# Patient Record
Sex: Female | Born: 1970 | Race: Black or African American | Hispanic: No | Marital: Single | Smoking: Former smoker
Health system: Southern US, Community
[De-identification: ages and names within clinical notes are randomized; demographics above are authoritative.]

## PROBLEM LIST (undated history)

## (undated) ENCOUNTER — Emergency Department (HOSPITAL_COMMUNITY): Admission: EM | Payer: PRIVATE HEALTH INSURANCE | Source: Home / Self Care

## (undated) DIAGNOSIS — O24419 Gestational diabetes mellitus in pregnancy, unspecified control: Secondary | ICD-10-CM

## (undated) DIAGNOSIS — K5792 Diverticulitis of intestine, part unspecified, without perforation or abscess without bleeding: Secondary | ICD-10-CM

## (undated) DIAGNOSIS — D72829 Elevated white blood cell count, unspecified: Secondary | ICD-10-CM

## (undated) DIAGNOSIS — I10 Essential (primary) hypertension: Secondary | ICD-10-CM

## (undated) DIAGNOSIS — Z113 Encounter for screening for infections with a predominantly sexual mode of transmission: Secondary | ICD-10-CM

## (undated) DIAGNOSIS — G44219 Episodic tension-type headache, not intractable: Secondary | ICD-10-CM

## (undated) HISTORY — DX: Episodic tension-type headache, not intractable: G44.219

## (undated) HISTORY — DX: Elevated white blood cell count, unspecified: D72.829

## (undated) HISTORY — DX: Gestational diabetes mellitus in pregnancy, unspecified control: O24.419

## (undated) HISTORY — DX: Diverticulitis of intestine, part unspecified, without perforation or abscess without bleeding: K57.92

## (undated) HISTORY — DX: Encounter for screening for infections with a predominantly sexual mode of transmission: Z11.3

---

## 1997-08-27 HISTORY — PX: TUBAL LIGATION: SHX77

## 2000-04-09 ENCOUNTER — Emergency Department (HOSPITAL_COMMUNITY): Admission: EM | Admit: 2000-04-09 | Discharge: 2000-04-09 | Payer: Self-pay | Admitting: Emergency Medicine

## 2001-03-14 ENCOUNTER — Emergency Department (HOSPITAL_COMMUNITY): Admission: EM | Admit: 2001-03-14 | Discharge: 2001-03-14 | Payer: Self-pay | Admitting: Emergency Medicine

## 2001-04-17 ENCOUNTER — Emergency Department (HOSPITAL_COMMUNITY): Admission: EM | Admit: 2001-04-17 | Discharge: 2001-04-18 | Payer: Self-pay | Admitting: Emergency Medicine

## 2001-12-04 ENCOUNTER — Emergency Department (HOSPITAL_COMMUNITY): Admission: EM | Admit: 2001-12-04 | Discharge: 2001-12-04 | Payer: Self-pay

## 2002-02-01 ENCOUNTER — Emergency Department (HOSPITAL_COMMUNITY): Admission: EM | Admit: 2002-02-01 | Discharge: 2002-02-02 | Payer: Self-pay | Admitting: Emergency Medicine

## 2002-02-02 ENCOUNTER — Encounter: Payer: Self-pay | Admitting: *Deleted

## 2002-08-18 ENCOUNTER — Emergency Department (HOSPITAL_COMMUNITY): Admission: EM | Admit: 2002-08-18 | Discharge: 2002-08-18 | Payer: Self-pay | Admitting: Emergency Medicine

## 2003-03-23 ENCOUNTER — Emergency Department (HOSPITAL_COMMUNITY): Admission: EM | Admit: 2003-03-23 | Discharge: 2003-03-23 | Payer: Self-pay | Admitting: Emergency Medicine

## 2003-04-22 ENCOUNTER — Emergency Department (HOSPITAL_COMMUNITY): Admission: EM | Admit: 2003-04-22 | Discharge: 2003-04-22 | Payer: Self-pay | Admitting: Emergency Medicine

## 2003-04-23 ENCOUNTER — Encounter: Payer: Self-pay | Admitting: Emergency Medicine

## 2003-04-23 ENCOUNTER — Ambulatory Visit (HOSPITAL_COMMUNITY): Admission: RE | Admit: 2003-04-23 | Discharge: 2003-04-23 | Payer: Self-pay | Admitting: Emergency Medicine

## 2003-12-16 ENCOUNTER — Emergency Department (HOSPITAL_COMMUNITY): Admission: EM | Admit: 2003-12-16 | Discharge: 2003-12-16 | Payer: Self-pay | Admitting: Emergency Medicine

## 2010-05-27 LAB — HM PAP SMEAR: HM Pap smear: NORMAL

## 2010-08-27 LAB — HM PAP SMEAR: HM Pap smear: NORMAL

## 2010-12-09 ENCOUNTER — Encounter (HOSPITAL_COMMUNITY): Payer: Self-pay | Admitting: Radiology

## 2010-12-09 ENCOUNTER — Emergency Department (HOSPITAL_COMMUNITY): Payer: PRIVATE HEALTH INSURANCE

## 2010-12-09 ENCOUNTER — Emergency Department (HOSPITAL_COMMUNITY)
Admission: EM | Admit: 2010-12-09 | Discharge: 2010-12-09 | Disposition: A | Discharge status: Home / Self Care | Payer: PRIVATE HEALTH INSURANCE | Attending: Emergency Medicine | Admitting: Emergency Medicine

## 2010-12-09 DIAGNOSIS — R109 Unspecified abdominal pain: Secondary | ICD-10-CM | POA: Insufficient documentation

## 2010-12-09 DIAGNOSIS — I10 Essential (primary) hypertension: Secondary | ICD-10-CM | POA: Insufficient documentation

## 2010-12-09 DIAGNOSIS — K7689 Other specified diseases of liver: Secondary | ICD-10-CM | POA: Insufficient documentation

## 2010-12-09 DIAGNOSIS — K5730 Diverticulosis of large intestine without perforation or abscess without bleeding: Secondary | ICD-10-CM | POA: Insufficient documentation

## 2010-12-09 HISTORY — DX: Essential (primary) hypertension: I10

## 2010-12-09 LAB — CBC
HCT: 40.1 % (ref 36.0–46.0)
Hemoglobin: 13.3 g/dL (ref 12.0–15.0)
MCH: 28.9 pg (ref 26.0–34.0)
MCHC: 33.2 g/dL (ref 30.0–36.0)
MCV: 87 fL (ref 78.0–100.0)
Platelets: 287 10*3/uL (ref 150–400)
RBC: 4.61 MIL/uL (ref 3.87–5.11)
RDW: 13.5 % (ref 11.5–15.5)
WBC: 11.1 10*3/uL — ABNORMAL HIGH (ref 4.0–10.5)

## 2010-12-09 LAB — URINALYSIS, ROUTINE W REFLEX MICROSCOPIC
Bilirubin Urine: NEGATIVE
Glucose, UA: NEGATIVE mg/dL
Hgb urine dipstick: NEGATIVE
Ketones, ur: NEGATIVE mg/dL
Nitrite: NEGATIVE
Protein, ur: NEGATIVE mg/dL
Specific Gravity, Urine: 1.035 — ABNORMAL HIGH (ref 1.005–1.030)
Urobilinogen, UA: 1 mg/dL (ref 0.0–1.0)

## 2010-12-09 LAB — DIFFERENTIAL
Basophils Absolute: 0 10*3/uL (ref 0.0–0.1)
Basophils Relative: 0 % (ref 0–1)
Eosinophils Absolute: 0.1 10*3/uL (ref 0.0–0.7)
Eosinophils Relative: 1 % (ref 0–5)
Lymphocytes Relative: 21 % (ref 12–46)
Lymphs Abs: 2.4 10*3/uL (ref 0.7–4.0)
Monocytes Absolute: 0.8 10*3/uL (ref 0.1–1.0)
Monocytes Relative: 7 % (ref 3–12)
Neutro Abs: 7.9 10*3/uL — ABNORMAL HIGH (ref 1.7–7.7)
Neutrophils Relative %: 71 % (ref 43–77)

## 2010-12-09 LAB — COMPREHENSIVE METABOLIC PANEL
ALT: 18 U/L (ref 0–35)
AST: 17 U/L (ref 0–37)
Albumin: 3.3 g/dL — ABNORMAL LOW (ref 3.5–5.2)
Alkaline Phosphatase: 50 U/L (ref 39–117)
BUN: 12 mg/dL (ref 6–23)
CO2: 27 mEq/L (ref 19–32)
Calcium: 8.8 mg/dL (ref 8.4–10.5)
Chloride: 107 mEq/L (ref 96–112)
Creatinine, Ser: 0.87 mg/dL (ref 0.4–1.2)
GFR calc Af Amer: >60 mL/min (ref >60)
GFR calc non Af Amer: >60 mL/min (ref >60)
Glucose, Bld: 83 mg/dL (ref 70–99)
Sodium: 141 mEq/L (ref 135–145)
Total Bilirubin: 0.5 mg/dL (ref 0.3–1.2)

## 2010-12-09 LAB — LIPASE, BLOOD: Lipase: 30 U/L (ref 11–59)

## 2010-12-09 MED ORDER — IOHEXOL 300 MG/ML  SOLN
100.0000 mL | Freq: Once | INTRAMUSCULAR | Status: AC | PRN
  Administered 2010-12-09: 100 mL via INTRAVENOUS

## 2011-02-24 ENCOUNTER — Emergency Department (INDEPENDENT_AMBULATORY_CARE_PROVIDER_SITE_OTHER): Payer: PRIVATE HEALTH INSURANCE

## 2011-02-24 ENCOUNTER — Emergency Department (HOSPITAL_BASED_OUTPATIENT_CLINIC_OR_DEPARTMENT_OTHER)
Admission: EM | Admit: 2011-02-24 | Discharge: 2011-02-24 | Disposition: A | Discharge status: Home / Self Care | Payer: PRIVATE HEALTH INSURANCE | Attending: Emergency Medicine | Admitting: Emergency Medicine

## 2011-02-24 DIAGNOSIS — R079 Chest pain, unspecified: Secondary | ICD-10-CM

## 2011-02-24 DIAGNOSIS — I10 Essential (primary) hypertension: Secondary | ICD-10-CM | POA: Insufficient documentation

## 2011-02-24 LAB — BASIC METABOLIC PANEL
BUN: 14 mg/dL (ref 6–23)
Chloride: 102 mEq/L (ref 96–112)
GFR calc Af Amer: >60 mL/min (ref >60)
GFR calc non Af Amer: >60 mL/min (ref >60)
Potassium: 3.8 mEq/L (ref 3.5–5.1)
Sodium: 138 mEq/L (ref 135–145)

## 2011-02-24 LAB — CBC
HCT: 40.1 % (ref 36.0–46.0)
Hemoglobin: 13.8 g/dL (ref 12.0–15.0)
MCH: 29.2 pg (ref 26.0–34.0)
MCV: 85 fL (ref 78.0–100.0)
Platelets: 304 10*3/uL (ref 150–400)
RBC: 4.72 MIL/uL (ref 3.87–5.11)
WBC: 14.3 10*3/uL — ABNORMAL HIGH (ref 4.0–10.5)

## 2011-02-24 LAB — DIFFERENTIAL
Eosinophils Absolute: 0.1 10*3/uL (ref 0.0–0.7)
Lymphocytes Relative: 18 % (ref 12–46)
Lymphs Abs: 2.6 10*3/uL (ref 0.7–4.0)
Monocytes Relative: 9 % (ref 3–12)
Neutrophils Relative %: 72 % (ref 43–77)

## 2011-02-24 LAB — D-DIMER, QUANTITATIVE: D-Dimer, Quant: <0.22 ug/mL-FEU (ref 0.00–0.48)

## 2011-02-24 LAB — CK TOTAL AND CKMB (NOT AT ARMC): Relative Index: INVALID (ref 0.0–2.5)

## 2011-02-24 LAB — TROPONIN I: Troponin I: <0.3 ng/mL (ref <0.30)

## 2011-05-02 ENCOUNTER — Ambulatory Visit: Payer: PRIVATE HEALTH INSURANCE | Admitting: Family

## 2011-05-04 ENCOUNTER — Encounter: Payer: Self-pay | Admitting: Family

## 2011-05-04 ENCOUNTER — Ambulatory Visit (INDEPENDENT_AMBULATORY_CARE_PROVIDER_SITE_OTHER): Payer: PRIVATE HEALTH INSURANCE | Admitting: Family

## 2011-05-04 VITALS — BP 130/102 | HR 78 | Temp 98.0°F | Resp 16 | Ht 63.5 in | Wt 198.0 lb

## 2011-05-04 DIAGNOSIS — N390 Urinary tract infection, site not specified: Secondary | ICD-10-CM

## 2011-05-04 DIAGNOSIS — R350 Frequency of micturition: Secondary | ICD-10-CM

## 2011-05-04 DIAGNOSIS — I10 Essential (primary) hypertension: Secondary | ICD-10-CM

## 2011-05-04 LAB — POCT URINALYSIS DIPSTICK
Glucose, UA: NEGATIVE
Ketones, UA: NEGATIVE
Protein, UA: NEGATIVE
Spec Grav, UA: 1.005

## 2011-05-04 LAB — BASIC METABOLIC PANEL
BUN: 9 mg/dL (ref 6–23)
Chloride: 104 mEq/L (ref 96–112)
Potassium: 4.1 mEq/L (ref 3.5–5.3)
Sodium: 138 mEq/L (ref 135–145)

## 2011-05-04 MED ORDER — VALSARTAN-HYDROCHLOROTHIAZIDE 80-12.5 MG PO TABS: 1.0000 | ORAL_TABLET | Freq: Every day | ORAL | Status: DC

## 2011-05-04 NOTE — Patient Instructions (Signed)
Please follow up in 1 month for a fasting physical. Welcome to [facility]! 

## 2011-05-04 NOTE — Assessment & Plan Note (Addendum)
Deteriorated. Will have pt restart diovan HCT as she reports that this has kept her BP well controlled.  Check BMET today.  Pt to return fasting in 1 month for CPX and Pap

## 2011-05-04 NOTE — Assessment & Plan Note (Signed)
Will check glucose and will send urine for culture.

## 2011-05-04 NOTE — Progress Notes (Signed)
Subjective:    Patient ID: Kristin Herrera, female    DOB: 05/26/1971, 40 y.o.   MRN: [id number]  HPI  HTN- has had htn for 10 years.  She has been followed at [facility] on [address].  She has been on Diovan HCT (S/p Tubal ligation).  Out of meds for 2 weeks.  Reports that pressure has been well controlled when she is on Diovan HCT.  Denies chest pain, shortness of breath, swelling in her feet.  She does note HA since she ran out of her medication.  Diverticulitis-  She had episode this past summer, was treated with antibiotics and resolved on own.    Urinary frequency- started about 1 week ago. She denies associated fever or dysuria.      Review of Systems  Constitutional: Negative for fever and unexpected weight change.  HENT: Negative for hearing loss.   Eyes: Negative for visual disturbance.  Respiratory: Negative for shortness of breath.   Cardiovascular: Negative for chest pain and leg swelling.  Gastrointestinal: Negative for abdominal pain.  Genitourinary: Positive for frequency.       Urinary frequency- for 1 week.  Musculoskeletal: Negative for myalgias and arthralgias.  Neurological: Positive for headaches.  Hematological: Negative for adenopathy.  Psychiatric/Behavioral:       Denies depression/anxiety   Past Medical History  Diagnosis Date  . Hypertension   . Frequent episodic tension-type headache   . Diverticulitis   . Gestational diabetes     History   Social History  . Marital Status: Married    Spouse Name: N/A    Number of Children: 2  . Years of Education: N/A   Occupational History  . HOUSEHOLD COORDINATOR    Social History Main Topics  . Smoking status: Former Smoker -- 10 years    Types: Cigarettes    Quit date: 03/10/2010  . Smokeless tobacco: Never Used  . Alcohol Use: No  . Drug Use: No  . Sexually Active: Not on file   Other Topics Concern  . Not on file   Social History Narrative   Caffeine Use:  1 dailyRegular exercise:   NoMarried2 children Son age 64 and daughter age 19Works at [employer] as a household Nurse, adult.Complete 12 th grade    Past Surgical History  Procedure Date  . Cesarean section 1991, 1999  . Tubal ligation 1999    Family History  Problem Relation Age of Onset  . Hypertension Mother   . Heart disease Father   . Heart disease Paternal Uncle   . Hypertension Maternal Grandmother   . Diabetes Maternal Grandmother   . Stroke Paternal Grandmother   . Hypertension Paternal Grandmother   . Diabetes Paternal Grandmother   . Heart disease Paternal Grandfather     No Known Allergies  No current outpatient prescriptions on file prior to visit.    BP 130/102  Pulse 78  Temp(Src) 98 F (36.7 C) (Oral)  Resp 16  Ht 5' 3.5" (1.613 m)  Wt 198 lb (89.812 kg)  BMI 34.52 kg/m2  LMP 04/22/2011       Objective:   Physical Exam  Constitutional: She appears well-developed and well-nourished.  Neck: Normal range of motion. Neck supple. No thyromegaly present.       Thick neck  Cardiovascular: Normal rate and regular rhythm.   No murmur heard. Pulmonary/Chest: Effort normal and breath sounds normal. No respiratory distress. She has no wheezes. She has no rales. She exhibits no tenderness.  Musculoskeletal: She exhibits  no edema.  Skin: Skin is warm and dry. No erythema.  Psychiatric: She has a normal mood and affect. Her behavior is normal. Judgment and thought content normal.          Assessment & Plan:

## 2011-05-06 ENCOUNTER — Encounter: Payer: Self-pay | Admitting: Family

## 2011-05-06 ENCOUNTER — Telehealth: Payer: Self-pay | Admitting: Family

## 2011-05-06 MED ORDER — CIPROFLOXACIN HCL 500 MG PO TABS: 500.0000 mg | ORAL_TABLET | Freq: Two times a day (BID) | ORAL | Status: AC

## 2011-05-06 NOTE — Telephone Encounter (Signed)
Pls let pt know that urine culture is +.  I have sent cipro to the pharmacy for her to start.

## 2011-05-07 LAB — URINE CULTURE: Colony Count: 75000

## 2011-05-07 NOTE — Telephone Encounter (Signed)
Left detailed message on pt's cell# and to call if any questions. 

## 2011-06-04 ENCOUNTER — Other Ambulatory Visit: Payer: Self-pay | Admitting: Family

## 2011-06-04 ENCOUNTER — Ambulatory Visit (INDEPENDENT_AMBULATORY_CARE_PROVIDER_SITE_OTHER): Payer: PRIVATE HEALTH INSURANCE | Admitting: Family

## 2011-06-04 ENCOUNTER — Ambulatory Visit (HOSPITAL_BASED_OUTPATIENT_CLINIC_OR_DEPARTMENT_OTHER)
Admission: RE | Admit: 2011-06-04 | Discharge: 2011-06-04 | Disposition: A | Discharge status: Home / Self Care | Payer: PRIVATE HEALTH INSURANCE | Source: Ambulatory Visit | Attending: Family | Admitting: Family

## 2011-06-04 ENCOUNTER — Encounter: Payer: Self-pay | Admitting: Family

## 2011-06-04 DIAGNOSIS — E010 Iodine-deficiency related diffuse (endemic) goiter: Secondary | ICD-10-CM | POA: Insufficient documentation

## 2011-06-04 DIAGNOSIS — E049 Nontoxic goiter, unspecified: Secondary | ICD-10-CM

## 2011-06-04 DIAGNOSIS — Z0000 Encounter for general adult medical examination without abnormal findings: Secondary | ICD-10-CM | POA: Insufficient documentation

## 2011-06-04 DIAGNOSIS — M79609 Pain in unspecified limb: Secondary | ICD-10-CM

## 2011-06-04 DIAGNOSIS — M79673 Pain in unspecified foot: Secondary | ICD-10-CM | POA: Insufficient documentation

## 2011-06-04 DIAGNOSIS — I10 Essential (primary) hypertension: Secondary | ICD-10-CM

## 2011-06-04 LAB — CBC WITH DIFFERENTIAL/PLATELET
Basophils Absolute: 0 10*3/uL (ref 0.0–0.1)
Basophils Relative: 0 % (ref 0–1)
Eosinophils Relative: 1 % (ref 0–5)
HCT: 41.5 % (ref 36.0–46.0)
MCHC: 32.5 g/dL (ref 30.0–36.0)
MCV: 87.9 fL (ref 78.0–100.0)
Monocytes Absolute: 0.7 10*3/uL (ref 0.1–1.0)
Neutro Abs: 7.2 10*3/uL (ref 1.7–7.7)
Platelets: 294 10*3/uL (ref 150–400)
RDW: 13.6 % (ref 11.5–15.5)

## 2011-06-04 LAB — HEPATIC FUNCTION PANEL
ALT: 14 U/L (ref 0–35)
Albumin: 4.6 g/dL (ref 3.5–5.2)
Total Protein: 7.8 g/dL (ref 6.0–8.3)

## 2011-06-04 LAB — BASIC METABOLIC PANEL WITH GFR
BUN: 19 mg/dL (ref 6–23)
CO2: 27 mEq/L (ref 19–32)
Chloride: 99 mEq/L (ref 96–112)
GFR, Est Non African American: >60 mL/min (ref >60)
Glucose, Bld: 109 mg/dL — ABNORMAL HIGH (ref 70–99)
Potassium: 4.2 mEq/L (ref 3.5–5.3)

## 2011-06-04 LAB — TSH: TSH: 2.237 u[IU]/mL (ref 0.350–4.500)

## 2011-06-04 LAB — LIPID PANEL: Cholesterol: 218 mg/dL — ABNORMAL HIGH (ref 0–200)

## 2011-06-04 MED ORDER — CALCIUM CARBONATE-VITAMIN D 600-400 MG-UNIT PO TABS: 1.0000 | ORAL_TABLET | Freq: Two times a day (BID) | ORAL | Status: DC

## 2011-06-04 NOTE — Assessment & Plan Note (Addendum)
40 yr old female presents today for CPX.  Tetanus is up to date. She wishes to obtain flu shot from her employer and will schedule her Pap with her GYN.  She was counselled today on diet, exercise and weight loss.  Recommended that she try to keep her calories to 1200 a day (can keep track with a free app for her smart phone) and get regular exercise.  Not eating 3 servings of dairy a day. Recommended that she add Caltrate for bone health.

## 2011-06-04 NOTE — Assessment & Plan Note (Signed)
BP looks good today.  Monitor.  

## 2011-06-04 NOTE — Progress Notes (Signed)
Subjective:    Patient ID: Kristin Herrera, female    DOB: 15-Sep-1970, 40 y.o.   MRN: [id number]  HPI  Ms.  Kristin Herrera is a 40 yr old female who presents today for her annual physical.  She also has concerns about heel pain in the right foot.   1) Preventative-  Not exercising.  Wants to start a routine.  Diet- likes starches/breads/pasta- trying to cut back.  Will get her flu shot at work. Tetanus up to date. Pap will be done by GYN- pt will make apt.   2) HTN- Currently on diovan-hct.  (she is s/p BTL).   Initial reading in office today was 98/74, follow up reading 110/78.    3) Heel spurs- right foot. Reports that she saw a specialist a [facility].  Had cortisone injection in the past which helped.  Now starting to act up on her again.     Review of Systems  Constitutional: Negative for fever.  HENT: Negative for congestion.   Respiratory: Negative for cough.   Cardiovascular: Negative for chest pain and leg swelling.  Gastrointestinal: Negative for nausea, vomiting and diarrhea.  Genitourinary: Negative for menstrual problem.  Musculoskeletal: Negative for myalgias and arthralgias.  Skin: Negative for rash.  Neurological: Negative for weakness, numbness and headaches.  Hematological: Negative for adenopathy.  Psychiatric/Behavioral:       Denies depression/anxiety   Past Medical History  Diagnosis Date  . Hypertension   . Frequent episodic tension-type headache   . Diverticulitis   . Gestational diabetes     History   Social History  . Marital Status: Married    Spouse Name: N/A    Number of Children: 2  . Years of Education: N/A   Occupational History  . HOUSEHOLD COORDINATOR    Social History Main Topics  . Smoking status: Former Smoker -- 10 years    Types: Cigarettes    Quit date: 03/10/2010  . Smokeless tobacco: Never Used  . Alcohol Use: No  . Drug Use: No  . Sexually Active: Not on file   Other Topics Concern  . Not on file   Social History  Narrative   Caffeine Use:  1 dailyRegular exercise:  NoMarried2 children Son age 65 and daughter age 9Works at [employer] as a household Nurse, adult.Complete 12 th grade    Past Surgical History  Procedure Date  . Cesarean section 1991, 1999  . Tubal ligation 1999    Family History  Problem Relation Age of Onset  . Hypertension Mother   . Heart disease Father   . Heart disease Paternal Uncle   . Hypertension Maternal Grandmother   . Diabetes Maternal Grandmother   . Stroke Paternal Grandmother   . Hypertension Paternal Grandmother   . Diabetes Paternal Grandmother   . Heart disease Paternal Grandfather     No Known Allergies  Current Outpatient Prescriptions on File Prior to Visit  Medication Sig Dispense Refill  . valsartan-hydrochlorothiazide (DIOVAN-HCT) 80-12.5 MG per tablet Take 1 tablet by mouth daily.  30 tablet  2    BP 98/74  Pulse 72  Temp(Src) 98.1 F (36.7 C) (Oral)  Resp 16  Ht 5' 3.5" (1.613 m)  Wt 200 lb 1.3 oz (90.756 kg)  BMI 34.89 kg/m2  LMP 04/28/2011       Objective:   Physical Exam  Constitutional: She is oriented to person, place, and time. She appears well-developed and well-nourished. No distress.  HENT:  Head: Normocephalic and atraumatic.  Right Ear:  Tympanic membrane and ear canal normal.  Left Ear: Tympanic membrane and ear canal normal.  Nose: Nose normal.  Mouth/Throat: Uvula is midline, oropharynx is clear and moist and mucous membranes are normal.  Eyes: Conjunctivae are normal. Pupils are equal, round, and reactive to light. No scleral icterus.  Neck: Normal range of motion. Neck supple. Thyromegaly present.  Cardiovascular: Normal rate and regular rhythm.   No murmur heard. Pulmonary/Chest: Breath sounds normal. No respiratory distress. She has no wheezes. She has no rales. She exhibits no tenderness.  Abdominal: Soft. Bowel sounds are normal. She exhibits no distension and no mass. There is no tenderness. There is no rebound  and no guarding.  Genitourinary:       Deferred to GYN  Musculoskeletal: She exhibits no edema.  Neurological: She is alert and oriented to person, place, and time. She displays normal reflexes. Coordination normal.  Skin: Skin is warm and dry. No erythema.  Psychiatric: She has a normal mood and affect. Her behavior is normal. Judgment and thought content normal.          Assessment & Plan:

## 2011-06-04 NOTE — Assessment & Plan Note (Signed)
Pt noted to have enlarged thyroid on exam.  Will have pt complete an ultrasound of the thyroid as well as TSH.

## 2011-06-04 NOTE — Assessment & Plan Note (Signed)
Pt with reported hx of heel spurs, now with worsening pain in the right heel. Will refer to Dr. Pearletha Forge for further evaluation.

## 2011-06-04 NOTE — Patient Instructions (Addendum)
[email or web address] Try to keep your calories to 1200 calories a day. Walk for 20-30 minutes 5 days a week.   Complete your fasting lab work on the first floor.   Please schedule your ultrasound of your thyroid on the first floor.   Follow up in 3 months.

## 2011-06-05 ENCOUNTER — Telehealth: Payer: Self-pay | Admitting: *Deleted

## 2011-06-05 ENCOUNTER — Encounter: Payer: Self-pay | Admitting: Family

## 2011-06-05 NOTE — Telephone Encounter (Signed)
Message copied by Kathi Simpers on Tue Jun 05, 2011  1:50 PM ------      Message from: O'SULLIVAN, MELISSA      Created: Tue Jun 05, 2011 10:21 AM       Could you pls call [facility] and ask them to add on an A1C diagnosis hyperglycemia?

## 2011-06-05 NOTE — Telephone Encounter (Signed)
Test has been added per [facility]. 

## 2011-06-06 ENCOUNTER — Ambulatory Visit (INDEPENDENT_AMBULATORY_CARE_PROVIDER_SITE_OTHER): Payer: PRIVATE HEALTH INSURANCE | Admitting: Family Medicine

## 2011-06-06 ENCOUNTER — Telehealth: Payer: Self-pay | Admitting: *Deleted

## 2011-06-06 ENCOUNTER — Encounter: Payer: Self-pay | Admitting: Family Medicine

## 2011-06-06 DIAGNOSIS — E010 Iodine-deficiency related diffuse (endemic) goiter: Secondary | ICD-10-CM

## 2011-06-06 DIAGNOSIS — M79673 Pain in unspecified foot: Secondary | ICD-10-CM

## 2011-06-06 DIAGNOSIS — M79609 Pain in unspecified limb: Secondary | ICD-10-CM

## 2011-06-06 DIAGNOSIS — R7303 Prediabetes: Secondary | ICD-10-CM

## 2011-06-06 NOTE — Assessment & Plan Note (Signed)
Right plantar fasciitis - shown home stretches and eccentric exercises to do daily - handout provided.  Icing, tylenol/aleve as needed for pain.  Arch strap to wear regularly.  Heel lifts provided as well.  She may benefit from OTC orthotics with her significant pes planus as well.  We discussed plantar fascia injection but this is typically 2nd or 3rd line treatment and only if she doesn't get better with the above.  See instructions for further.  Will f/u in 4-6 weeks for reevaluation.

## 2011-06-06 NOTE — Telephone Encounter (Signed)
Pt left message requesting results of ultrasound and labs. Please advise.

## 2011-06-06 NOTE — Progress Notes (Signed)
Subjective:    Patient ID: Kristin Herrera, female    DOB: December 25, 1970, 40 y.o.   MRN: [id number]  PCP: Sandford Craze  HPI 40 yo F here for right heel pain.  Patient reports having similar pain about 4-5 years ago having been diagnosed with plantar fasciitis. She was seen at [facility] and given a cortisone injection in heel which resolved pain up until about 1 month ago. States no new injury. Pain started plantar foot about 1 month ago and has worsened since then. Minimal pain in left foot. Causing her to limp at times and developing pain in right lateral hamstring. Has tried changing shoes. Not icing, taking any meds. Has not done any PT or home exercises for this in the past.  Past Medical History  Diagnosis Date  . Hypertension   . Frequent episodic tension-type headache   . Diverticulitis   . Gestational diabetes     Current Outpatient Prescriptions on File Prior to Visit  Medication Sig Dispense Refill  . Calcium Carbonate-Vitamin D (CALTRATE 600+D) 600-400 MG-UNIT per tablet Take 1 tablet by mouth 2 (two) times daily.      . valsartan-hydrochlorothiazide (DIOVAN-HCT) 80-12.5 MG per tablet Take 1 tablet by mouth daily.  30 tablet  2    Past Surgical History  Procedure Date  . Cesarean section 1991, 1999  . Tubal ligation 1999    No Known Allergies  History   Social History  . Marital Status: Married    Spouse Name: N/A    Number of Children: 2  . Years of Education: N/A   Occupational History  . HOUSEHOLD COORDINATOR    Social History Main Topics  . Smoking status: Former Smoker -- 10 years    Types: Cigarettes    Quit date: 03/10/2010  . Smokeless tobacco: Never Used  . Alcohol Use: No  . Drug Use: No  . Sexually Active: Not on file   Other Topics Concern  . 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 Hypertension Mother   . Heart disease Father   . Heart disease Paternal Uncle   . Hypertension Maternal Grandmother   . Diabetes Maternal Grandmother   . Stroke Paternal Grandmother   . Hypertension Paternal Grandmother   . Diabetes Paternal Grandmother   . Heart disease Paternal Grandfather     BP 108/75  Ht 5\' 3"  (1.6 m)  Wt 200 lb (90.719 kg)  BMI 35.43 kg/m2  LMP 04/28/2011  Review of Systems See HPI above.    Objective:   Physical Exam Gen: NAD  R foot/ankle: No gross deformity, swelling, ecchymoses Pes planus. FROM ankle without pain. TTP plantar anterior portion of calcaneus.  No TTP achilles or elsewhere foot/ankle. Negative ant drawer and talar tilt.   Negative syndesmotic compression. Thompsons test negative. NV intact distally.    Assessment & Plan:  1. Right plantar fasciitis - shown home stretches and eccentric exercises to do daily - handout provided.  Icing, tylenol/aleve as needed for pain.  Arch strap to wear regularly.  Heel lifts provided as well.  She may benefit from OTC orthotics with her significant pes planus as well.  We discussed plantar fascia injection but this is typically 2nd or 3rd line treatment and only if she  doesn't get better with the above.  See instructions for further.  Will f/u in 4-6 weeks for reevaluation.

## 2011-06-06 NOTE — Telephone Encounter (Signed)
Returned pt's call.  Left message on voice mail requesting call back.

## 2011-06-06 NOTE — Patient Instructions (Signed)
You have plantar fasciitis Take tylenol or aleve as needed for pain  Plantar fascia stretch for 20-30 seconds (do 3 of these) in morning Lowering/raise on a step exercises 3 x 15 once or twice a day - this is very important for long term recovery. Can add heel walks, toe walks forward and backward as well Ice bucket 10-15 minutes at end of day - can ice 3-4 times a day. Avoid flat shoes/barefoot walking as much as possible. Arch straps have been shown to help with pain. Heel lifts also help with pain by avoiding fully stretching the plantar fascia except when doing home exercises. Orthotics with heel lift may be helpful - can try over the counter Dr. Jari Sportsman insoles with arch support. Steroid injection is a consideration for short term pain relief if you are struggling. Physical therapy is also an option.

## 2011-06-08 ENCOUNTER — Encounter: Payer: Self-pay | Admitting: Family

## 2011-06-08 NOTE — Telephone Encounter (Signed)
Left message on home and work phones.

## 2011-06-08 NOTE — Telephone Encounter (Signed)
Pt called back stating she could be reached on her work # [phone] or cell# [phone].

## 2011-06-11 DIAGNOSIS — R7303 Prediabetes: Secondary | ICD-10-CM | POA: Insufficient documentation

## 2011-06-11 NOTE — Assessment & Plan Note (Signed)
Diabetic diet was reviewed with patient today. Also recommended weight loss and regular exercise.  Pt instructed to keep her 3 month follow up appointment.

## 2011-06-11 NOTE — Assessment & Plan Note (Signed)
Normal TSH on labs.  Ultrasound notes mild nonspecific heterogeneous thyroid echotexture.  Will plan for a follow up ultrasound in 3-6 months.  Pt aware.

## 2011-06-27 ENCOUNTER — Encounter: Payer: Self-pay | Admitting: Family

## 2011-06-28 ENCOUNTER — Encounter (HOSPITAL_BASED_OUTPATIENT_CLINIC_OR_DEPARTMENT_OTHER): Payer: Self-pay | Admitting: *Deleted

## 2011-06-28 ENCOUNTER — Emergency Department (HOSPITAL_BASED_OUTPATIENT_CLINIC_OR_DEPARTMENT_OTHER)
Admission: EM | Admit: 2011-06-28 | Discharge: 2011-06-28 | Disposition: A | Discharge status: Home / Self Care | Payer: PRIVATE HEALTH INSURANCE | Attending: Emergency Medicine | Admitting: Emergency Medicine

## 2011-06-28 DIAGNOSIS — I10 Essential (primary) hypertension: Secondary | ICD-10-CM | POA: Insufficient documentation

## 2011-06-28 DIAGNOSIS — IMO0001 Reserved for inherently not codable concepts without codable children: Secondary | ICD-10-CM | POA: Insufficient documentation

## 2011-06-28 DIAGNOSIS — R197 Diarrhea, unspecified: Secondary | ICD-10-CM | POA: Insufficient documentation

## 2011-06-28 LAB — CBC
MCH: 28.7 pg (ref 26.0–34.0)
MCV: 85.1 fL (ref 78.0–100.0)
Platelets: 331 10*3/uL (ref 150–400)
RBC: 4.98 MIL/uL (ref 3.87–5.11)
RDW: 13.7 % (ref 11.5–15.5)

## 2011-06-28 LAB — DIFFERENTIAL
Eosinophils Relative: 1 % (ref 0–5)
Lymphs Abs: 2.1 10*3/uL (ref 0.7–4.0)
Monocytes Absolute: 1.4 10*3/uL — ABNORMAL HIGH (ref 0.1–1.0)
Neutro Abs: 14.2 10*3/uL — ABNORMAL HIGH (ref 1.7–7.7)

## 2011-06-28 LAB — URINALYSIS, ROUTINE W REFLEX MICROSCOPIC
Bilirubin Urine: NEGATIVE
Hgb urine dipstick: NEGATIVE
Specific Gravity, Urine: 1.034 — ABNORMAL HIGH (ref 1.005–1.030)
Urobilinogen, UA: 0.2 mg/dL (ref 0.0–1.0)

## 2011-06-28 LAB — COMPREHENSIVE METABOLIC PANEL
ALT: 10 U/L (ref 0–35)
AST: 12 U/L (ref 0–37)
CO2: 23 mEq/L (ref 19–32)
Calcium: 9.5 mg/dL (ref 8.4–10.5)
Chloride: 102 mEq/L (ref 96–112)
GFR calc non Af Amer: >90 mL/min (ref >90)
Potassium: 3.6 mEq/L (ref 3.5–5.1)
Sodium: 136 mEq/L (ref 135–145)

## 2011-06-28 LAB — CLOSTRIDIUM DIFFICILE BY PCR: Toxigenic C. Difficile by PCR: NEGATIVE

## 2011-06-28 MED ORDER — ONDANSETRON HCL 4 MG/2ML IJ SOLN
4.0000 mg | Freq: Once | INTRAMUSCULAR | Status: AC
  Administered 2011-06-28: 4 mg via INTRAVENOUS
  Filled 2011-06-28: qty 2

## 2011-06-28 MED ORDER — METRONIDAZOLE 500 MG PO TABS
500.0000 mg | ORAL_TABLET | Freq: Once | ORAL | Status: AC
  Administered 2011-06-28: 500 mg via ORAL
  Filled 2011-06-28: qty 1

## 2011-06-28 MED ORDER — SODIUM CHLORIDE 0.9 % IV BOLUS (SEPSIS)
1000.0000 mL | Freq: Once | INTRAVENOUS | Status: AC
  Administered 2011-06-28: 1000 mL via INTRAVENOUS

## 2011-06-28 MED ORDER — METRONIDAZOLE 500 MG PO TABS: 500.0000 mg | ORAL_TABLET | Freq: Three times a day (TID) | ORAL | Status: DC

## 2011-06-28 MED ORDER — PROMETHAZINE HCL 25 MG PO TABS: 25.0000 mg | ORAL_TABLET | Freq: Four times a day (QID) | ORAL | Status: AC | PRN

## 2011-06-28 NOTE — ED Notes (Signed)
Diarrhea x 3 days. No relief with Imodium. No vomiting. States she feels weak. Woke from sleep with diarrhea this am.

## 2011-06-28 NOTE — ED Provider Notes (Signed)
History     CSN: [id number] Arrival date & time: 06/28/2011  4:21 AM   First MD Initiated Contact with Patient 06/28/11 0421      Chief Complaint  Patient presents with  . Diarrhea    (Consider location/radiation/quality/duration/timing/severity/associated sxs/prior treatment) Patient is a 40 y.o. female presenting with diarrhea. The history is provided by the patient.  Diarrhea The primary symptoms include fatigue and diarrhea. Primary symptoms do not include fever, abdominal pain, vomiting, dysuria, myalgias or rash. The illness began 3 to 5 days ago. The onset was gradual. The problem has been gradually worsening.  The illness is also significant for bloating. The illness does not include chills, constipation or back pain. Associated medical issues do not include gallstones, liver disease or irritable bowel syndrome. 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   No known sick contacts. No fevers. No abdominal pain. Some belching but no nausea or vomiting. Symptoms began this form of frequent stools about 3 days ago and stent has worsened to persistent watery diarrhea. Patient denies any recent antibiotics. No rashes. No travel of the country. No camping or well water.  no blood in stools. Moderate to severe as patient is having frequent stools that her sleep and daily activities. She called her PCP who recommended she start Imodium but is not helping.  Past Medical History  Diagnosis Date  . Hypertension   . Frequent episodic tension-type headache   . Diverticulitis   . Gestational diabetes     Past Surgical History  Procedure Date  . Cesarean section 1991, 1999  . Tubal ligation 1999    Family History  Problem Relation Age of Onset  . Hypertension Mother   . Heart disease Father   . Heart disease Paternal Uncle   . Hypertension Maternal Grandmother   . Diabetes Maternal Grandmother   . Stroke Paternal Grandmother   . Hypertension Paternal Grandmother   . Diabetes  Paternal Grandmother   . Heart disease Paternal Grandfather     History  Substance Use Topics  . Smoking status: Former Smoker -- 10 years    Types: Cigarettes    Quit date: 03/10/2010  . Smokeless tobacco: Never Used  . Alcohol Use: No    OB History    Grav Para Term Preterm Abortions TAB SAB Ect Mult Living                  Review of Systems  Constitutional: Positive for fatigue. Negative for fever and chills.  HENT: Negative for neck pain and neck stiffness.   Eyes: Negative for pain.  Respiratory: Negative for shortness of breath.   Cardiovascular: Negative for chest pain.  Gastrointestinal: Positive for diarrhea and bloating. Negative for vomiting, abdominal pain, constipation, blood in stool, anal bleeding and rectal pain.  Genitourinary: Negative for dysuria and flank pain.  Musculoskeletal: Negative for myalgias, back pain and joint swelling.  Skin: Negative for rash.  Neurological: Negative for headaches.  All other systems reviewed and are negative.    Allergies  Review of patient's allergies indicates no known allergies.  Home Medications   Current Outpatient Rx  Name Route Sig Dispense Refill  . CALCIUM CARBONATE-VITAMIN D 600-400 MG-UNIT PO TABS Oral Take 1 tablet by mouth 2 (two) times daily.    Marland Kitchen VALSARTAN-HYDROCHLOROTHIAZIDE 80-12.5 MG PO TABS Oral Take 1 tablet by mouth daily. 30 tablet 2    BP 128/81  Pulse 115  Temp(Src) 98 F (36.7 C) (Oral)  Resp 18  SpO2  98%  LMP 04/28/2011  Physical Exam  Constitutional: She is oriented to person, place, and time. She appears well-developed and well-nourished.  HENT:  Head: Normocephalic and atraumatic.       Dry mucous membranes  Eyes: Conjunctivae and EOM are normal. Pupils are equal, round, and reactive to light. No scleral icterus.  Neck: Trachea normal. Neck supple. No thyromegaly present.  Cardiovascular: Regular rhythm, S1 normal, S2 normal and normal pulses.     No systolic murmur is present    No diastolic murmur is present  Pulses:      Radial pulses are 2+ on the right side, and 2+ on the left side.       Mild tachycardia  Pulmonary/Chest: Effort normal and breath sounds normal. She has no wheezes. She has no rhonchi. She has no rales. She exhibits no tenderness.  Abdominal: Soft. Normal appearance and bowel sounds are normal. She exhibits no mass. There is no tenderness. There is no rebound, no guarding, no CVA tenderness and negative Murphy's sign.  Musculoskeletal:       BLE:s Calves nontender, no cords or erythema, negative Homans sign  Lymphadenopathy:    She has no cervical adenopathy.  Neurological: She is alert and oriented to person, place, and time. She has normal strength. No cranial nerve deficit or sensory deficit. GCS eye subscore is 4. GCS verbal subscore is 5. GCS motor subscore is 6.  Skin: Skin is warm and dry. No rash noted. She is not diaphoretic.  Psychiatric: Her speech is normal.       Cooperative and appropriate    ED Course  Procedures (including critical care time)  Results for orders placed during the hospital encounter of 06/28/11  COMPREHENSIVE METABOLIC PANEL      Component Value Range   Sodium 136  135 - 145 (mEq/L)   Potassium 3.6  3.5 - 5.1 (mEq/L)   Chloride 102  96 - 112 (mEq/L)   CO2 23  19 - 32 (mEq/L)   Glucose, Bld 117 (*) 70 - 99 (mg/dL)   BUN 16  6 - 23 (mg/dL)   Creatinine, Ser 0.45  0.50 - 1.10 (mg/dL)   Calcium 9.5  8.4 - 40.9 (mg/dL)   Total Protein 8.5 (*) 6.0 - 8.3 (g/dL)   Albumin 4.0  3.5 - 5.2 (g/dL)   AST 12  0 - 37 (U/L)   ALT 10  0 - 35 (U/L)   Alkaline Phosphatase 90  39 - 117 (U/L)   Total Bilirubin 0.5  0.3 - 1.2 (mg/dL)   GFR calc non Af Amer >90  >90 (mL/min)   GFR calc Af Amer >90  >90 (mL/min)  CBC      Component Value Range   WBC 17.9 (*) 4.0 - 10.5 (K/uL)   RBC 4.98  3.87 - 5.11 (MIL/uL)   Hemoglobin 14.3  12.0 - 15.0 (g/dL)   HCT 81.1  91.4 - 78.2 (%)   MCV 85.1  78.0 - 100.0 (fL)   MCH 28.7   26.0 - 34.0 (pg)   MCHC 33.7  30.0 - 36.0 (g/dL)   RDW 95.6  21.3 - 08.6 (%)   Platelets 331  150 - 400 (K/uL)  DIFFERENTIAL      Component Value Range   Neutrophils Relative 79 (*) 43 - 77 (%)   Lymphocytes Relative 12  12 - 46 (%)   Monocytes Relative 8  3 - 12 (%)   Eosinophils Relative 1  0 - 5 (%)  Basophils Relative 0  0 - 1 (%)   Neutro Abs 14.2 (*) 1.7 - 7.7 (K/uL)   Lymphs Abs 2.1  0.7 - 4.0 (K/uL)   Monocytes Absolute 1.4 (*) 0.1 - 1.0 (K/uL)   Eosinophils Absolute 0.2  0.0 - 0.7 (K/uL)   Basophils Absolute 0.0  0.0 - 0.1 (K/uL)   WBC Morphology TOXIC GRANULATION          MDM   Persistent watery diarrhea in a patient to works in a nursing home and has no exposure to recent antibiotics, no travel or recent camping/well water. I am mostly concerned the patient has C diff. Stool sample for culture and C. difficile PCR was sent, the results will not be available today. Labs reviewed as above she does have significant leukocytosis. She's given IV fluids and Zofran with by mouth Flagyl.  She has no fevers or abdominal pain or vomiting at this point is able to tolerate by mouth medications and fluids. Tachycardia resolved with normal saline. She has close primary care followup and understands recommendations to not return to work until she is cleared by her doctor. She also understands that she should not take Imodium. Reliable historian states understanding strict return precautions and need for close followup with her doctor and C. difficile results and for release back to work.         Sunnie Nielsen, MD 06/28/11 [phone]

## 2011-06-29 ENCOUNTER — Encounter: Payer: Self-pay | Admitting: Family

## 2011-06-29 ENCOUNTER — Ambulatory Visit (INDEPENDENT_AMBULATORY_CARE_PROVIDER_SITE_OTHER): Payer: PRIVATE HEALTH INSURANCE | Admitting: Family

## 2011-06-29 ENCOUNTER — Telehealth: Payer: Self-pay | Admitting: Family

## 2011-06-29 ENCOUNTER — Other Ambulatory Visit: Payer: Self-pay | Admitting: Family

## 2011-06-29 VITALS — BP 98/70 | HR 72 | Temp 97.8°F | Resp 16 | Wt 201.1 lb

## 2011-06-29 DIAGNOSIS — R197 Diarrhea, unspecified: Secondary | ICD-10-CM

## 2011-06-29 DIAGNOSIS — R739 Hyperglycemia, unspecified: Secondary | ICD-10-CM

## 2011-06-29 DIAGNOSIS — A088 Other specified intestinal infections: Secondary | ICD-10-CM

## 2011-06-29 DIAGNOSIS — A084 Viral intestinal infection, unspecified: Secondary | ICD-10-CM

## 2011-06-29 DIAGNOSIS — R7309 Other abnormal glucose: Secondary | ICD-10-CM

## 2011-06-29 LAB — CBC WITH DIFFERENTIAL/PLATELET
Basophils Absolute: 0 10*3/uL (ref 0.0–0.1)
Basophils Relative: 0 % (ref 0–1)
Eosinophils Absolute: 0.3 10*3/uL (ref 0.0–0.7)
Eosinophils Relative: 3 % (ref 0–5)
HCT: 37.7 % (ref 36.0–46.0)
MCH: 28.7 pg (ref 26.0–34.0)
MCHC: 33.2 g/dL (ref 30.0–36.0)
Monocytes Absolute: 0.6 10*3/uL (ref 0.1–1.0)
Neutro Abs: 8.4 10*3/uL — ABNORMAL HIGH (ref 1.7–7.7)
RDW: 13.4 % (ref 11.5–15.5)

## 2011-06-29 NOTE — Telephone Encounter (Signed)
Pt.notified

## 2011-06-29 NOTE — Patient Instructions (Signed)
Please complete your blood work prior to leaving today- we will let you know how it looks. Call if symptoms do not continue to improve. Go to the ER over the weekend if you develop worsening abdominal pain, diarrhea, nausea or inability to keep down food/liquid. Hold your blood pressure medication for the next 2-3 days until you have resumed a regular diet.

## 2011-06-29 NOTE — Telephone Encounter (Signed)
Please let pt know that her white blood cell count is nearly back to normal.  She can stop the metronidazole. Call us if symptoms do not continue to improve.

## 2011-06-29 NOTE — Progress Notes (Signed)
Subjective:    Patient ID: Kristin Herrera, female    DOB: 09-22-1970, 40 y.o.   MRN: [id number]  HPI  Ms.  Kristin Herrera is a 40 yr old female who presents today for ED follow up. She was seen in the ED yesterday due to worsening diarrhea.  Records are reviewed.  She was noted to have a leukocytosis.  A stool culture and c. Diff were obtained.  Stool culture remains no growth to date and C. Diff was negative.  She was started on empiric flagyl. Now tolerating fluids/applesauce.  She tells me that she just took her blood pressure medications. Last BM was yesterday at 1pm and was watery. She denies associated nausea, vomitting, black/bloody stools. She did report some abdominal pain was in her lower abdomen with belching, but has resolved.  She is taking the metronidazole tablets provided by the ED.    Hyperglycemia- Her sugar was noted to be 117 fasting in the ED.  She reports a + family hx of diabetes.  Review of Systems See HPI  Past Medical History  Diagnosis Date  . Hypertension   . Frequent episodic tension-type headache   . Diverticulitis   . Gestational diabetes     History   Social History  . Marital Status: Married    Spouse Name: N/A    Number of Children: 2  . Years of Education: N/A   Occupational History  . HOUSEHOLD COORDINATOR    Social History Main Topics  . Smoking status: Former Smoker -- 10 years    Types: Cigarettes    Quit date: 03/10/2010  . Smokeless tobacco: Never Used  . Alcohol Use: No  . Drug Use: No  . Sexually Active: Not on file   Other Topics Concern  . Not on file   Social History Narrative   Caffeine Use:  1 dailyRegular exercise:  NoMarried2 children Son age 4 and daughter age 8Works at [employer] as a household Nurse, adult.Complete 12 th grade    Past Surgical History  Procedure Date  . Cesarean section 1991, 1999  . Tubal ligation 1999    Family History  Problem Relation Age of Onset  . Hypertension Mother   . Heart disease Father    . Heart disease Paternal Uncle   . Hypertension Maternal Grandmother   . Diabetes Maternal Grandmother   . Stroke Paternal Grandmother   . Hypertension Paternal Grandmother   . Diabetes Paternal Grandmother   . Heart disease Paternal Grandfather     No Known Allergies  Current Outpatient Prescriptions on File Prior to Visit  Medication Sig Dispense Refill  . Calcium Carbonate-Vitamin D (CALTRATE 600+D) 600-400 MG-UNIT per tablet Take 1 tablet by mouth 2 (two) times daily.      . metroNIDAZOLE (FLAGYL) 500 MG tablet Take 1 tablet (500 mg total) by mouth 3 (three) times daily.  42 tablet  0  . valsartan-hydrochlorothiazide (DIOVAN-HCT) 80-12.5 MG per tablet Take 1 tablet by mouth daily.  30 tablet  2  . promethazine (PHENERGAN) 25 MG tablet Take 1 tablet (25 mg total) by mouth every 6 (six) hours as needed for nausea.  30 tablet  0    BP 98/70  Pulse 72  Temp(Src) 97.8 F (36.6 C) (Oral)  Resp 16  Wt 201 lb 1.3 oz (91.209 kg)  LMP 06/10/2011       Objective:   Physical Exam  Constitutional: She appears well-developed and well-nourished. No distress.  HENT:  Head: Normocephalic and atraumatic.  Cardiovascular:  Normal rate and regular rhythm.   No murmur heard. Pulmonary/Chest: Effort normal and breath sounds normal. No respiratory distress. She has no wheezes. She has no rales. She exhibits no tenderness.  Abdominal: Soft. Bowel sounds are normal. She exhibits no distension.       Mild epigastric tenderness without guarding or rebound.           Assessment & Plan:

## 2011-06-29 NOTE — Assessment & Plan Note (Signed)
Follow up of her CBC shows normalization of her white count.  Diarrhea appears resolved. Stool neg for C. Diff and culture remains no growth to date.  Plan to d/c empiric metronidazole.

## 2011-07-01 LAB — STOOL CULTURE

## 2011-08-02 ENCOUNTER — Telehealth: Payer: Self-pay | Admitting: Family

## 2011-08-02 NOTE — Telephone Encounter (Signed)
Received medical records from [facility]

## 2011-08-02 NOTE — Telephone Encounter (Signed)
Records forwarded to Provider for review. 

## 2011-08-20 ENCOUNTER — Other Ambulatory Visit: Payer: Self-pay | Admitting: Family

## 2011-08-20 NOTE — Telephone Encounter (Signed)
Rx refill sent to pharmacy. 

## 2011-09-04 ENCOUNTER — Encounter: Payer: Self-pay | Admitting: Family

## 2011-09-04 ENCOUNTER — Ambulatory Visit (INDEPENDENT_AMBULATORY_CARE_PROVIDER_SITE_OTHER): Payer: PRIVATE HEALTH INSURANCE | Admitting: Family

## 2011-09-04 VITALS — BP 102/78 | HR 66 | Temp 97.8°F | Resp 18 | Ht 63.5 in | Wt 197.1 lb

## 2011-09-04 DIAGNOSIS — E049 Nontoxic goiter, unspecified: Secondary | ICD-10-CM

## 2011-09-04 DIAGNOSIS — R7309 Other abnormal glucose: Secondary | ICD-10-CM

## 2011-09-04 DIAGNOSIS — E010 Iodine-deficiency related diffuse (endemic) goiter: Secondary | ICD-10-CM

## 2011-09-04 DIAGNOSIS — I10 Essential (primary) hypertension: Secondary | ICD-10-CM

## 2011-09-04 DIAGNOSIS — R739 Hyperglycemia, unspecified: Secondary | ICD-10-CM

## 2011-09-04 DIAGNOSIS — M25569 Pain in unspecified knee: Secondary | ICD-10-CM

## 2011-09-04 LAB — HEMOGLOBIN A1C: Mean Plasma Glucose: 123 mg/dL — ABNORMAL HIGH (ref <117)

## 2011-09-04 NOTE — Assessment & Plan Note (Signed)
Symptoms most consistent with OA.  I recommended that she try the elliptical instead of the treadmill as it might be a bit easier on her knees.  Recommended aleve BID for a few days to be followed by PRN tylenol.

## 2011-09-04 NOTE — Assessment & Plan Note (Signed)
Diffuse ill defined nodules on [place] in October.  Will plan to repeat US in 6 mos to re-evaluate stability/size.

## 2011-09-04 NOTE — Patient Instructions (Addendum)
Please follow up in 6 weeks. Happy New Year!

## 2011-09-04 NOTE — Assessment & Plan Note (Signed)
BP Readings from Last 3 Encounters:  09/04/11 102/78  06/29/11 98/70  06/28/11 107/73  BP has actually been running a little too low. I have asked her to cut her  diovan hct in half and follow up in 6 weeks for BP recheck.

## 2011-09-04 NOTE — Progress Notes (Signed)
Subjective:    Patient ID: Kristin Herrera, female    DOB: 03-26-1971, 41 y.o.   MRN: [id number]  HPI  Kristin Herrera is a 41 yr old female who presents today for follow up of HTN and to discuss knee pain.  1) Bilateral knee pain, notes that they pop, hurts.  She denies associated swelling/redness.  She recently started walking on a treadmill.  She has not taken any medication.    2) HTN- she continues diovan HCT without problems.  Denies associated CP/sob/edema.     Review of Systems  See HPI  Past Medical History  Diagnosis Date  . Hypertension   . Frequent episodic tension-type headache   . Diverticulitis   . Gestational diabetes     History   Social History  . Marital Status: Married    Spouse Name: N/A    Number of Children: 2  . Years of Education: N/A   Occupational History  . HOUSEHOLD COORDINATOR    Social History Main Topics  . Smoking status: Former Smoker -- 10 years    Types: Cigarettes    Quit date: 03/10/2010  . Smokeless tobacco: Never Used  . Alcohol Use: No  . Drug Use: No  . Sexually Active: Not on file   Other Topics Concern  . Not on file   Social History Narrative   Caffeine Use:  1 dailyRegular exercise:  NoMarried2 children Son age 52 and daughter age 39Works at [employer] as a household Nurse, adult.Complete 12 th grade    Past Surgical History  Procedure Date  . Cesarean section 1991, 1999  . Tubal ligation 1999    Family History  Problem Relation Age of Onset  . Hypertension Mother   . Heart disease Father   . Heart disease Paternal Uncle   . Hypertension Maternal Grandmother   . Diabetes Maternal Grandmother   . Stroke Paternal Grandmother   . Hypertension Paternal Grandmother   . Diabetes Paternal Grandmother   . Heart disease Paternal Grandfather     No Known Allergies  Current Outpatient Prescriptions on File Prior to Visit  Medication Sig Dispense Refill  . Calcium Carbonate-Vitamin D (CALTRATE 600+D) 600-400  MG-UNIT per tablet Take 1 tablet by mouth 2 (two) times daily.        BP 102/78  Pulse 66  Temp(Src) 97.8 F (36.6 C) (Oral)  Resp 18  Ht 5' 3.5" (1.613 m)  Wt 197 lb 1.3 oz (89.395 kg)  BMI 34.36 kg/m2  LMP 08/20/2011    Past Medical History  Diagnosis Date  . Hypertension   . Frequent episodic tension-type headache   . Diverticulitis   . Gestational diabetes     History   Social History  . Marital Status: Married    Spouse Name: N/A    Number of Children: 2  . Years of Education: N/A   Occupational History  . HOUSEHOLD COORDINATOR    Social History Main Topics  . Smoking status: Former Smoker -- 10 years    Types: Cigarettes    Quit date: 03/10/2010  . Smokeless tobacco: Never Used  . Alcohol Use: No  . Drug Use: No  . Sexually Active: Not on file   Other Topics Concern  . Not on file   Social History Narrative   Caffeine Use:  1 dailyRegular exercise:  NoMarried2 children Son age 60 and daughter age 62Works at [employer] as a household Nurse, adult.Complete 12 th grade    Past Surgical History  Procedure  Date  . Cesarean section 1991, 1999  . Tubal ligation 1999    Family History  Problem Relation Age of Onset  . Hypertension Mother   . Heart disease Father   . Heart disease Paternal Uncle   . Hypertension Maternal Grandmother   . Diabetes Maternal Grandmother   . Stroke Paternal Grandmother   . Hypertension Paternal Grandmother   . Diabetes Paternal Grandmother   . Heart disease Paternal Grandfather     No Known Allergies  Current Outpatient Prescriptions on File Prior to Visit  Medication Sig Dispense Refill  . Calcium Carbonate-Vitamin D (CALTRATE 600+D) 600-400 MG-UNIT per tablet Take 1 tablet by mouth 2 (two) times daily.      Marland Kitchen DIOVAN HCT 80-12.5 MG per tablet TAKE 1 TABLET BY MOUTH DAILY  30 tablet  2    BP 102/78  Pulse 66  Temp(Src) 97.8 F (36.6 C) (Oral)  Resp 18  Ht 5' 3.5" (1.613 m)  Wt 197 lb 1.3 oz (89.395 kg)  BMI  34.36 kg/m2  LMP 08/20/2011       Objective:   Physical Exam  Constitutional: She is oriented to person, place, and time. She appears well-developed and well-nourished. No distress.  Cardiovascular: Normal rate and regular rhythm.   No murmur heard. Pulmonary/Chest: Effort normal and breath sounds normal. No respiratory distress. She has no wheezes. She has no rales. She exhibits no tenderness.  Musculoskeletal: She exhibits no edema.       No knee swelling, no tenderness noted.   Neurological: She is alert and oriented to person, place, and time.  Psychiatric: She has a normal mood and affect. Her behavior is normal. Judgment and thought content normal.          Assessment & Plan:

## 2011-09-06 ENCOUNTER — Encounter: Payer: Self-pay | Admitting: Family

## 2011-09-18 ENCOUNTER — Ambulatory Visit (INDEPENDENT_AMBULATORY_CARE_PROVIDER_SITE_OTHER): Payer: PRIVATE HEALTH INSURANCE | Admitting: Family

## 2011-09-18 ENCOUNTER — Encounter: Payer: Self-pay | Admitting: Family

## 2011-09-18 DIAGNOSIS — J069 Acute upper respiratory infection, unspecified: Secondary | ICD-10-CM

## 2011-09-18 DIAGNOSIS — I10 Essential (primary) hypertension: Secondary | ICD-10-CM

## 2011-09-18 NOTE — Patient Instructions (Signed)
Please follow up in 3 months.   Upper Respiratory Infection, Adult An upper respiratory infection (URI) is also known as the common cold. It is often caused by a type of germ (virus). Colds are easily spread (contagious). You can pass it to others by kissing, coughing, sneezing, or drinking out of the same glass. Usually, you get better in 1 or 2 weeks.  HOME CARE   Only take medicine as told by your doctor.   Use a warm mist humidifier or breathe in steam from a hot shower.   Drink enough water and fluids to keep your pee (urine) clear or pale yellow.   Get plenty of rest.   Return to work when your temperature is back to normal or as told by your doctor. You may use a face mask and wash your hands to stop your cold from spreading.  GET HELP RIGHT AWAY IF:   After the first few days, you feel you are getting worse.   You have questions about your medicine.   You have chills, shortness of breath, or brown or red spit (mucus).   You have yellow or brown snot (nasal discharge) or pain in the face, especially when you bend forward.   You have a fever, puffy (swollen) neck, pain when you swallow, or white spots in the back of your throat.   You have a bad headache, ear pain, sinus pain, or chest pain.   You have a high-pitched whistling sound when you breathe in and out (wheezing).   You have a lasting cough or cough up blood.   You have sore muscles or a stiff neck.  MAKE SURE YOU:   Understand these instructions.   Will watch your condition.   Will get help right away if you are not doing well or get worse.  Document Released: 01/30/2008 Document Revised: 04/25/2011 Document Reviewed: 12/18/2010 [facility] Patient Information 2012 [place], [place].

## 2011-09-18 NOTE — Progress Notes (Signed)
  Subjective:    Patient ID: Kristin Herrera, female    DOB: 02/27/1971, 41 y.o.   MRN: [id number]  HPI [name] is a 41 yr old female who presents today with complaint of Nasal congestion.  Symptoms started Sunday. She has taken mucinex, alkaselzer, and tylenol cold.  This AM she reports that she is able to cough stuff up/out nose- clear, no fever.    HTN- reports that she has been taking a 1/2 tab of diovan HCT.  She is tolerating this dose without any problems.    Review of Systems    see HPI  Past Medical History  Diagnosis Date  . Hypertension   . Frequent episodic tension-type headache   . Diverticulitis   . Gestational diabetes     History   Social History  . Marital Status: Married    Spouse Name: N/A    Number of Children: 2  . Years of Education: N/A   Occupational History  . HOUSEHOLD COORDINATOR    Social History Main Topics  . Smoking status: Former Smoker -- 10 years    Types: Cigarettes    Quit date: 03/10/2010  . Smokeless tobacco: Never Used  . Alcohol Use: No  . Drug Use: No  . Sexually Active: Not on file   Other Topics Concern  . Not on file   Social History Narrative   Caffeine Use:  1 dailyRegular exercise:  NoMarried2 children Son age 40 and daughter age 61Works at [employer] as a household Nurse, adult.Complete 12 th grade    Past Surgical History  Procedure Date  . Cesarean section 1991, 1999  . Tubal ligation 1999    Family History  Problem Relation Age of Onset  . Hypertension Mother   . Heart disease Father   . Heart disease Paternal Uncle   . Hypertension Maternal Grandmother   . Diabetes Maternal Grandmother   . Stroke Paternal Grandmother   . Hypertension Paternal Grandmother   . Diabetes Paternal Grandmother   . Heart disease Paternal Grandfather     No Known Allergies  Current Outpatient Prescriptions on File Prior to Visit  Medication Sig Dispense Refill  . Calcium Carbonate-Vitamin D (CALTRATE 600+D) 600-400  MG-UNIT per tablet Take 1 tablet by mouth 2 (two) times daily.      . valsartan-hydrochlorothiazide (DIOVAN HCT) 80-12.5 MG per tablet Take 1 tablet by mouth daily.         BP 128/78  Pulse 78  Temp(Src) 97.9 F (36.6 C) (Oral)  Resp 16  Wt 201 lb 0.6 oz (91.191 kg)  SpO2 97%  LMP 08/14/2011    Objective:   Physical Exam  Constitutional: She appears well-developed and well-nourished. No distress.  HENT:  Right Ear: Tympanic membrane and ear canal normal.  Left Ear: Tympanic membrane and ear canal normal.  Mouth/Throat: No posterior oropharyngeal edema or posterior oropharyngeal erythema.  Cardiovascular: Normal rate and regular rhythm.   No murmur heard. Pulmonary/Chest: Effort normal and breath sounds normal. No respiratory distress. She has no wheezes. She has no rales.  Psychiatric: She has a normal mood and affect. Her behavior is normal. Judgment and thought content normal.          Assessment & Plan:

## 2011-09-18 NOTE — Assessment & Plan Note (Signed)
BP is improved, continue decreased dose of diovan hct.

## 2011-09-18 NOTE — Assessment & Plan Note (Signed)
I recommended mucinex, nasal spray. Pt to call if symptoms worsen or if not resolved by 1 week.

## 2011-10-19 ENCOUNTER — Ambulatory Visit: Payer: PRIVATE HEALTH INSURANCE | Admitting: Internal Medicine

## 2011-10-19 ENCOUNTER — Ambulatory Visit: Payer: PRIVATE HEALTH INSURANCE | Admitting: Family

## 2011-11-09 ENCOUNTER — Telehealth: Payer: Self-pay | Admitting: Family

## 2011-11-09 DIAGNOSIS — R9389 Abnormal findings on diagnostic imaging of other specified body structures: Secondary | ICD-10-CM

## 2011-11-09 NOTE — Telephone Encounter (Signed)
Patient returned phone call. Best # [phone]

## 2011-11-09 NOTE — Telephone Encounter (Signed)
Pls call pt and let her know that it is time to repeat her thyroid ultrasound.  I have pended order below.

## 2011-11-09 NOTE — Telephone Encounter (Signed)
Left detailed message on machine to return my call and let us know if she is ready to proceed with u/s.

## 2011-11-12 NOTE — Telephone Encounter (Signed)
Notified pt, she is agreeable to proceed with u/s but does not want this scheduled on Tuesdays or Thursdays. Pt also states she has had trouble cutting her BP med in half as instructed and wants to know if she can take 1 tablet every other day instead of cutting it in half?  Please advise.

## 2011-11-12 NOTE — Telephone Encounter (Signed)
Attempted to reach pt at work # but was told she was not in. Left message on cell # to return my call.

## 2011-11-13 NOTE — Telephone Encounter (Signed)
I would recommend that she try using a pill cutter.  If this is not a good option for her I can split the medication into two pills. Let me know which she prefers.

## 2011-11-13 NOTE — Telephone Encounter (Signed)
Left message on machine to return my call. 

## 2011-11-14 NOTE — Telephone Encounter (Signed)
Attempted to reach pt and left detailed message to call and let us know how she wants to proceed.

## 2011-11-14 NOTE — Telephone Encounter (Signed)
Pt called back stating she will try using a pill cutter and if that doesn't work she will let us know when she uses her current supply.

## 2011-11-26 ENCOUNTER — Other Ambulatory Visit (HOSPITAL_BASED_OUTPATIENT_CLINIC_OR_DEPARTMENT_OTHER): Payer: PRIVATE HEALTH INSURANCE

## 2011-11-26 LAB — HM PAP SMEAR: HM Pap smear: NORMAL

## 2011-11-29 ENCOUNTER — Ambulatory Visit (HOSPITAL_BASED_OUTPATIENT_CLINIC_OR_DEPARTMENT_OTHER)
Admission: RE | Admit: 2011-11-29 | Discharge: 2011-11-29 | Disposition: A | Discharge status: Home / Self Care | Payer: PRIVATE HEALTH INSURANCE | Source: Ambulatory Visit | Attending: Family | Admitting: Family

## 2011-11-29 DIAGNOSIS — E0789 Other specified disorders of thyroid: Secondary | ICD-10-CM

## 2011-11-29 DIAGNOSIS — E041 Nontoxic single thyroid nodule: Secondary | ICD-10-CM

## 2011-11-29 DIAGNOSIS — E049 Nontoxic goiter, unspecified: Secondary | ICD-10-CM | POA: Insufficient documentation

## 2011-11-29 DIAGNOSIS — R9389 Abnormal findings on diagnostic imaging of other specified body structures: Secondary | ICD-10-CM

## 2011-12-03 ENCOUNTER — Telehealth: Payer: Self-pay | Admitting: Family

## 2011-12-03 DIAGNOSIS — E041 Nontoxic single thyroid nodule: Secondary | ICD-10-CM

## 2011-12-03 NOTE — Telephone Encounter (Signed)
Left message for pt to return call.  When she calls back, please let her know that I have reviewed her thyroid ultrasound.  It is showing two nodules both slightly greater than 1 cm in diameter.  I would like to refer her to endocrinology for further evaluation.  Referral pended below.

## 2011-12-03 NOTE — Telephone Encounter (Signed)
Spoke with pt. Reviewed plans/results as below.  Pt is agreeable.

## 2012-05-30 ENCOUNTER — Other Ambulatory Visit: Payer: Self-pay | Admitting: Family

## 2012-06-04 ENCOUNTER — Other Ambulatory Visit: Payer: Self-pay | Admitting: Family

## 2012-06-04 ENCOUNTER — Ambulatory Visit (INDEPENDENT_AMBULATORY_CARE_PROVIDER_SITE_OTHER): Payer: PRIVATE HEALTH INSURANCE | Admitting: Family

## 2012-06-04 ENCOUNTER — Encounter: Payer: Self-pay | Admitting: Family

## 2012-06-04 VITALS — BP 128/78 | HR 75 | Temp 98.5°F | Resp 16 | Ht 63.0 in | Wt 200.1 lb

## 2012-06-04 DIAGNOSIS — Z111 Encounter for screening for respiratory tuberculosis: Secondary | ICD-10-CM

## 2012-06-04 DIAGNOSIS — I10 Essential (primary) hypertension: Secondary | ICD-10-CM

## 2012-06-04 DIAGNOSIS — R739 Hyperglycemia, unspecified: Secondary | ICD-10-CM

## 2012-06-04 DIAGNOSIS — R7309 Other abnormal glucose: Secondary | ICD-10-CM

## 2012-06-04 DIAGNOSIS — R7303 Prediabetes: Secondary | ICD-10-CM

## 2012-06-04 DIAGNOSIS — Z0000 Encounter for general adult medical examination without abnormal findings: Secondary | ICD-10-CM

## 2012-06-04 DIAGNOSIS — Z1231 Encounter for screening mammogram for malignant neoplasm of breast: Secondary | ICD-10-CM

## 2012-06-04 LAB — CBC WITH DIFFERENTIAL/PLATELET
Basophils Absolute: 0 10*3/uL (ref 0.0–0.1)
Eosinophils Relative: 1 % (ref 0–5)
HCT: 43 % (ref 36.0–46.0)
Hemoglobin: 13.9 g/dL (ref 12.0–15.0)
Lymphocytes Relative: 23 % (ref 12–46)
Lymphs Abs: 2.3 10*3/uL (ref 0.7–4.0)
MCV: 90.3 fL (ref 78.0–100.0)
Monocytes Absolute: 0.6 10*3/uL (ref 0.1–1.0)
Neutro Abs: 6.9 10*3/uL (ref 1.7–7.7)
RBC: 4.76 MIL/uL (ref 3.87–5.11)
WBC: 9.8 10*3/uL (ref 4.0–10.5)

## 2012-06-04 LAB — BASIC METABOLIC PANEL WITH GFR
CO2: 28 mEq/L (ref 19–32)
Chloride: 104 mEq/L (ref 96–112)
GFR, Est Non African American: >89 mL/min
Potassium: 4.3 mEq/L (ref 3.5–5.3)
Sodium: 140 mEq/L (ref 135–145)

## 2012-06-04 LAB — HEPATIC FUNCTION PANEL
ALT: 12 U/L (ref 0–35)
Alkaline Phosphatase: 74 U/L (ref 39–117)
Bilirubin, Direct: 0.1 mg/dL (ref 0.0–0.3)
Indirect Bilirubin: 0.4 mg/dL (ref 0.0–0.9)
Total Protein: 7.3 g/dL (ref 6.0–8.3)

## 2012-06-04 LAB — LIPID PANEL
HDL: 49 mg/dL (ref >39)
LDL Cholesterol: 126 mg/dL — ABNORMAL HIGH (ref 0–99)
Triglycerides: 51 mg/dL (ref <150)
VLDL: 10 mg/dL (ref 0–40)

## 2012-06-04 LAB — TSH: TSH: 1.077 u[IU]/mL (ref 0.350–4.500)

## 2012-06-04 MED ORDER — CALCIUM-VITAMIN D-VITAMIN K 500-100-40 MG-UNT-MCG PO CHEW: CHEWABLE_TABLET | ORAL | Status: DC

## 2012-06-04 NOTE — Assessment & Plan Note (Signed)
Obtain A1C. 

## 2012-06-04 NOTE — Assessment & Plan Note (Signed)
BP stable, continue diovan hct.  Obtain bmet.

## 2012-06-04 NOTE — Addendum Note (Signed)
Addended by: Mervin Kung A on: 06/04/2012 11:43 AM   Modules accepted: Orders

## 2012-06-04 NOTE — Progress Notes (Signed)
Subjective:    Patient ID: Kristin Herrera, female    DOB: 1971/04/13, 41 y.o.   MRN: [id number]  HPI  [name] is a 41 yr old female who presents today for cpx.  She brings with her today a form for school which is requiring documentation of MMR, varicella, tetanus and TB. Pt is fasting. Pt up to date on tetanus. due for pap smear. Will get flu vaccine thru employer this month.  Immunizations: tetanus up to date Diet: Reports diet is 'about the same" eats more at night. Exercise: rare exercise (walking) Pap Smear: will do thru gyn Mammogram: due for 1st mammo      Review of Systems  Constitutional: Negative for unexpected weight change.  HENT: Negative for hearing loss and congestion.   Eyes: Negative for visual disturbance.  Respiratory: Negative for cough and shortness of breath.   Cardiovascular: Negative for chest pain.  Gastrointestinal: Negative for nausea, vomiting and diarrhea.  Genitourinary: Negative for dysuria, frequency and menstrual problem.  Musculoskeletal: Negative for myalgias and arthralgias.  Skin: Negative for rash.  Neurological:       Reports some sinus headaches- resolves with tylenol.  Hematological: Negative for adenopathy.  Psychiatric/Behavioral:       Denies depression/anxiety.     Past Medical History  Diagnosis Date  . Hypertension   . Frequent episodic tension-type headache   . Diverticulitis   . Gestational diabetes     History   Social History  . Marital Status: Married    Spouse Name: N/A    Number of Children: 2  . Years of Education: N/A   Occupational History  . HOUSEHOLD COORDINATOR    Social History Main Topics  . Smoking status: Former Smoker -- 10 years    Types: Cigarettes    Quit date: 03/10/2010  . Smokeless tobacco: Never Used  . Alcohol Use: No  . Drug Use: No  . Sexually Active: Not on file   Other Topics Concern  . Not on file   Social History Narrative   Caffeine Use:  1 dailyRegular exercise:   NoMarried2 children Son age 33 and daughter age 21Works at [employer] as a household Nurse, adult.Complete 12 th grade    Past Surgical History  Procedure Date  . Cesarean section 1991, 1999  . Tubal ligation 1999    Family History  Problem Relation Age of Onset  . Hypertension Mother   . Heart disease Father   . Heart disease Paternal Uncle   . Hypertension Maternal Grandmother   . Diabetes Maternal Grandmother   . Stroke Paternal Grandmother   . Hypertension Paternal Grandmother   . Diabetes Paternal Grandmother   . Heart disease Paternal Grandfather     No Known Allergies  Current Outpatient Prescriptions on File Prior to Visit  Medication Sig Dispense Refill  . DIOVAN HCT 80-12.5 MG per tablet TAKE 1 TABLET BY MOUTH DAILY  30 tablet  0  . levonorgestrel (MIRENA) 20 MCG/24HR IUD 1 each by Intrauterine route once.      . valsartan-hydrochlorothiazide (DIOVAN HCT) 80-12.5 MG per tablet Take 0.5 tablets by mouth daily.       . Calcium-Vitamin D-Vitamin K 500-100-40 MG-UNT-MCG CHEW One chew by mouth twice daily    0    BP 128/78  Pulse 75  Temp 98.5 F (36.9 C) (Oral)  Resp 16  Ht 5\' 3"  (1.6 m)  Wt 200 lb 1.3 oz (90.756 kg)  BMI 35.44 kg/m2  SpO2 99%  LMP  05/11/2012       Objective:   Physical Exam  Physical Exam  Constitutional: She is oriented to person, place, and time. She appears well-developed and well-nourished. No distress.  HENT:  Head: Normocephalic and atraumatic.  Right Ear: Tympanic membrane and ear canal normal.  Left Ear: Tympanic membrane and ear canal normal.  Mouth/Throat: Oropharynx is clear and moist.  Eyes: Pupils are equal, round, and reactive to light. No scleral icterus.  Neck: Normal range of motion. No thyromegaly present.  Cardiovascular: Normal rate and regular rhythm.   No murmur heard. Pulmonary/Chest: Effort normal and breath sounds normal. No respiratory distress. He has no wheezes. She has no rales. She exhibits no tenderness.   Abdominal: Soft. Bowel sounds are normal. He exhibits no distension and no mass. There is no tenderness. There is no rebound and no guarding.  Musculoskeletal: She exhibits no edema.  Lymphadenopathy:    She has no cervical adenopathy.  Neurological: She is alert and oriented to person, place, and time. She has normal reflexes. She exhibits normal muscle tone. Coordination normal.  Skin: Skin is warm and dry.  Psychiatric: She has a normal mood and affect. Her behavior is normal. Judgment and thought content normal.  Breasts: Examined lying Right: Without masses, retractions, discharge or axillary adenopathy.  Left: Without masses, retractions, discharge or axillary adenopathy.  Pelvic- deferred to GYN          Assessment & Plan:          Assessment & Plan:  Preventative care- pt counseled on diet, exercise, weight loss. PPD placed today. Obtain fasting lab work, refer for mammogram. She will schedule pap through GYN

## 2012-06-04 NOTE — Patient Instructions (Addendum)
Please fax hepatitis B immunization records to [phone]. Please complete your blood work prior to leaving. Follow up in 6 months- sooner if problems/concerns.

## 2012-06-05 LAB — URINALYSIS, ROUTINE W REFLEX MICROSCOPIC
Bilirubin Urine: NEGATIVE
Ketones, ur: NEGATIVE mg/dL
Nitrite: NEGATIVE
Protein, ur: NEGATIVE mg/dL
Specific Gravity, Urine: 1.02 (ref 1.005–1.030)
Urobilinogen, UA: 0.2 mg/dL (ref 0.0–1.0)

## 2012-06-05 LAB — URINALYSIS, MICROSCOPIC ONLY
Bacteria, UA: NONE SEEN
Casts: NONE SEEN
Crystals: NONE SEEN
Squamous Epithelial / LPF: NONE SEEN

## 2012-06-05 LAB — MEASLES/MUMPS/RUBELLA IMMUNITY: Rubeola IgG: 4.71 {ISR} — ABNORMAL HIGH

## 2012-06-05 LAB — VARICELLA ZOSTER ANTIBODY, IGG: Varicella IgG: 3.34 {ISR} — ABNORMAL HIGH (ref <0.90)

## 2012-06-06 ENCOUNTER — Encounter: Payer: PRIVATE HEALTH INSURANCE | Admitting: Family

## 2012-06-06 ENCOUNTER — Encounter: Payer: PRIVATE HEALTH INSURANCE | Admitting: *Deleted

## 2012-06-06 ENCOUNTER — Ambulatory Visit (HOSPITAL_BASED_OUTPATIENT_CLINIC_OR_DEPARTMENT_OTHER)
Admission: RE | Admit: 2012-06-06 | Discharge: 2012-06-06 | Disposition: A | Discharge status: Home / Self Care | Payer: PRIVATE HEALTH INSURANCE | Source: Ambulatory Visit | Attending: Family | Admitting: Family

## 2012-06-06 DIAGNOSIS — Z1231 Encounter for screening mammogram for malignant neoplasm of breast: Secondary | ICD-10-CM

## 2012-06-06 DIAGNOSIS — R922 Inconclusive mammogram: Secondary | ICD-10-CM | POA: Insufficient documentation

## 2012-06-10 ENCOUNTER — Telehealth: Payer: Self-pay | Admitting: Family

## 2012-06-10 NOTE — Telephone Encounter (Signed)
Left message requesting call back.   Please let pt know that I have reviewed her mammogram and the radiologist is requesting some asymmetry in the left breast.  She should be contacted from the breast center for some additional imaging.  Let us know if she does not hear from them in the next 1 week. Blood work shows immunity to measles, mumps, rubella and chicken pox.  A1C remains stable in "borderline diabetes" range at 6.2. Cholesterol almost at goal. LDL 126 (ideally should be <100) She should continue to work on low fat/low cholesterol diet.  Urine had blood- did she have her period that day?  If not, lets have her repeat UA with reflex micro in 1 month (microscopic hematuria).

## 2012-06-10 NOTE — Telephone Encounter (Signed)
Spoke with pt. Reviewed below. She reports that she did have her period the day she gave urine sample.

## 2012-06-11 ENCOUNTER — Other Ambulatory Visit: Payer: Self-pay | Admitting: Family

## 2012-06-11 DIAGNOSIS — R928 Other abnormal and inconclusive findings on diagnostic imaging of breast: Secondary | ICD-10-CM

## 2012-06-13 ENCOUNTER — Ambulatory Visit (INDEPENDENT_AMBULATORY_CARE_PROVIDER_SITE_OTHER): Payer: PRIVATE HEALTH INSURANCE | Admitting: Family

## 2012-06-13 DIAGNOSIS — Z111 Encounter for screening for respiratory tuberculosis: Secondary | ICD-10-CM

## 2012-06-16 ENCOUNTER — Ambulatory Visit (INDEPENDENT_AMBULATORY_CARE_PROVIDER_SITE_OTHER): Payer: PRIVATE HEALTH INSURANCE | Admitting: Family

## 2012-06-16 DIAGNOSIS — Z111 Encounter for screening for respiratory tuberculosis: Secondary | ICD-10-CM

## 2012-06-16 NOTE — Progress Notes (Signed)
  Subjective:    Patient ID: Kristin Herrera, female    DOB: 1970-10-13, 41 y.o.   MRN: [id number]  HPI  The patient presented to the office after 48 hours for assessment of PPD skin test placed on 06/13/12.  Review of Systems     Objective:   Physical Exam  Small bruised area noted at injection site. No induration noted.      Assessment & Plan:   Negative PPD skin test.

## 2012-06-18 ENCOUNTER — Telehealth: Payer: Self-pay | Admitting: *Deleted

## 2012-06-18 NOTE — Telephone Encounter (Signed)
Physical exam / immunization form completed. Left message for pt to return my call.

## 2012-06-19 ENCOUNTER — Encounter: Payer: Self-pay | Admitting: Internal Medicine

## 2012-06-19 ENCOUNTER — Ambulatory Visit (INDEPENDENT_AMBULATORY_CARE_PROVIDER_SITE_OTHER): Payer: PRIVATE HEALTH INSURANCE | Admitting: Internal Medicine

## 2012-06-19 ENCOUNTER — Telehealth: Payer: Self-pay | Admitting: *Deleted

## 2012-06-19 VITALS — BP 128/82 | HR 78 | Temp 98.2°F | Resp 12 | Wt 200.1 lb

## 2012-06-19 DIAGNOSIS — H6690 Otitis media, unspecified, unspecified ear: Secondary | ICD-10-CM

## 2012-06-19 MED ORDER — AZITHROMYCIN 250 MG PO TABS: ORAL_TABLET | ORAL | Status: AC

## 2012-06-19 NOTE — Telephone Encounter (Signed)
Pt returning call from 10.23.13. Pt also states not feeling well. She feels like she has a sinus infection. Pt scheduled appt to come in.

## 2012-06-19 NOTE — Assessment & Plan Note (Signed)
Attempt antibiotic. Followup if no improvement or worsening.

## 2012-06-19 NOTE — Progress Notes (Signed)
  Subjective:    Patient ID: Kristin Herrera, female    DOB: [birth year], 41 y.o.   MRN: [id number]  HPI patient presents to clinic for evaluation of URI symptoms. Notes two-day history of nasal congestion with green drainage, sinus pressure and bilateral ear discomfort. No cough, fever or shortness of breath. No alleviating or exacerbating factors. Positive sick exposures  Past Medical History  Diagnosis Date  . Hypertension   . Frequent episodic tension-type headache   . Diverticulitis   . Gestational diabetes    Past Surgical History  Procedure Date  . Cesarean section 1991, 1999  . Tubal ligation 1999    reports that she quit smoking about 2 years ago. Her smoking use included Cigarettes. She quit after 10 years of use. She has never used smokeless tobacco. She reports that she does not drink alcohol or use illicit drugs. family history includes Diabetes in her maternal grandmother and paternal grandmother; Heart disease in her father, paternal grandfather, and paternal uncle; Hypertension in her maternal grandmother, mother, and paternal grandmother; and Stroke in her paternal grandmother. No Known Allergies    Review of Systems see history of present illness     Objective:   Physical Exam  Nursing note and vitals reviewed. Constitutional: She appears well-developed and well-nourished. No distress.  HENT:  Head: Normocephalic and atraumatic.  Right Ear: External ear and ear canal normal. Tympanic membrane is erythematous and retracted.  Left Ear: Tympanic membrane, external ear and ear canal normal.  Nose: Nose normal.  Mouth/Throat: Oropharynx is clear and moist. No oropharyngeal exudate.  Eyes: Conjunctivae normal are normal. Right eye exhibits no discharge. Left eye exhibits no discharge. No scleral icterus.  Neck: Neck supple.  Lymphadenopathy:    She has no cervical adenopathy.  Neurological: She is alert.  Skin: She is not diaphoretic.  Psychiatric: She has a normal  mood and affect.          Assessment & Plan:

## 2012-06-20 ENCOUNTER — Ambulatory Visit
Admission: RE | Admit: 2012-06-20 | Discharge: 2012-06-20 | Disposition: A | Discharge status: Home / Self Care | Payer: PRIVATE HEALTH INSURANCE | Source: Ambulatory Visit | Attending: Family | Admitting: Family

## 2012-06-20 DIAGNOSIS — R928 Other abnormal and inconclusive findings on diagnostic imaging of breast: Secondary | ICD-10-CM

## 2012-06-20 NOTE — Telephone Encounter (Signed)
Pt presented to the office, form copied / sent for scanning and original given to pt.

## 2012-08-25 ENCOUNTER — Other Ambulatory Visit: Payer: Self-pay | Admitting: Family

## 2012-08-25 MED ORDER — VALSARTAN-HYDROCHLOROTHIAZIDE 80-12.5 MG PO TABS: 0.5000 | ORAL_TABLET | Freq: Every day | ORAL | Status: DC

## 2012-08-25 NOTE — Telephone Encounter (Signed)
Diovan hct 80-12.5mg , 1 tablet daily denied as current med list indicates directions are 1/2 tablet daily. Refill sent with current directions (1/2 tablet daily).

## 2012-09-02 ENCOUNTER — Ambulatory Visit: Payer: PRIVATE HEALTH INSURANCE | Admitting: Family

## 2012-09-03 ENCOUNTER — Encounter: Payer: Self-pay | Admitting: Family

## 2012-09-03 ENCOUNTER — Ambulatory Visit (INDEPENDENT_AMBULATORY_CARE_PROVIDER_SITE_OTHER): Payer: PRIVATE HEALTH INSURANCE | Admitting: Family

## 2012-09-03 VITALS — BP 110/80 | HR 86 | Temp 98.5°F | Resp 16 | Wt 203.0 lb

## 2012-09-03 DIAGNOSIS — R7309 Other abnormal glucose: Secondary | ICD-10-CM

## 2012-09-03 DIAGNOSIS — I10 Essential (primary) hypertension: Secondary | ICD-10-CM

## 2012-09-03 DIAGNOSIS — N920 Excessive and frequent menstruation with regular cycle: Secondary | ICD-10-CM

## 2012-09-03 DIAGNOSIS — R7303 Prediabetes: Secondary | ICD-10-CM

## 2012-09-03 DIAGNOSIS — R739 Hyperglycemia, unspecified: Secondary | ICD-10-CM

## 2012-09-03 NOTE — Assessment & Plan Note (Signed)
Due for A1C.  Will check.

## 2012-09-03 NOTE — Assessment & Plan Note (Signed)
Improving.  Will obtain CBC, request [facility] ED records.  Instructed pt to follow up with GYN.

## 2012-09-03 NOTE — Patient Instructions (Addendum)
Please schedule follow up visit with GYN. Complete your lab work prior to leaving.

## 2012-09-03 NOTE — Progress Notes (Signed)
Subjective:    Patient ID: Kristin Herrera, female    DOB: 22-Feb-1971, 42 y.o.   MRN: [id number]  HPI  [name] is a 42 yr old female who presents today for ED follow up. She reports that she was seen in the [facility] ED on 08/22/12 due to vaginal bleeding.  She continues to bleed, but now is only spotting.  Patient has had a mirena in place since 3/13. Reports that bleeding started on 12/23. On 12/24 she had severe bleeding with large clots.  Now she is back to spotting. She was following with Ar[facility] in [place]. She reports feeling fatigued.  Has some abdominal cramping.    Review of Systems See HPI  Past Medical History  Diagnosis Date  . Hypertension   . Frequent episodic tension-type headache   . Diverticulitis   . Gestational diabetes     History   Social History  . Marital Status: Married    Spouse Name: N/A    Number of Children: 2  . Years of Education: N/A   Occupational History  . HOUSEHOLD COORDINATOR    Social History Main Topics  . Smoking status: Former Smoker -- 10 years    Types: Cigarettes    Quit date: 03/10/2010  . Smokeless tobacco: Never Used  . Alcohol Use: No  . Drug Use: No  . Sexually Active: Not on file   Other Topics Concern  . Not on file   Social History Narrative   Caffeine Use:  1 dailyRegular exercise:  NoMarried2 children Son age 70 and daughter age 31Works at [employer] as a household Nurse, adult.Complete 12 th grade    Past Surgical History  Procedure Date  . Cesarean section 1991, 1999  . Tubal ligation 1999    Family History  Problem Relation Age of Onset  . Hypertension Mother   . Heart disease Father   . Heart disease Paternal Uncle   . Hypertension Maternal Grandmother   . Diabetes Maternal Grandmother   . Stroke Paternal Grandmother   . Hypertension Paternal Grandmother   . Diabetes Paternal Grandmother   . Heart disease Paternal Grandfather     No Known Allergies  Current Outpatient Prescriptions on File  Prior to Visit  Medication Sig Dispense Refill  . Calcium-Vitamin D-Vitamin K 500-100-40 MG-UNT-MCG CHEW One chew by mouth twice daily    0  . levonorgestrel (MIRENA) 20 MCG/24HR IUD 1 each by Intrauterine route once.      . Omega-3 Fatty Acids (FISH OIL) 1000 MG CAPS Take by mouth.      . valsartan-hydrochlorothiazide (DIOVAN HCT) 80-12.5 MG per tablet Take 0.5 tablets by mouth daily.  15 tablet  4    BP 110/80  Pulse 86  Temp 98.5 F (36.9 C) (Oral)  Resp 16  Wt 203 lb (92.08 kg)  SpO2 99%  LMP 08/18/2012       Objective:   Physical Exam  Constitutional: She is oriented to person, place, and time. She appears well-developed and well-nourished. No distress.  Cardiovascular: Normal rate and regular rhythm.   No murmur heard. Pulmonary/Chest: Effort normal and breath sounds normal. No respiratory distress. She has no wheezes. She has no rales. She exhibits no tenderness.  Abdominal: Soft. Bowel sounds are normal.       Mild lower abdominal tenderness to palpation. No guarding  Neurological: She is alert and oriented to person, place, and time.  Skin: Skin is warm and dry.  Psychiatric: She has a normal  mood and affect. Her behavior is normal. Judgment and thought content normal.          Assessment & Plan:

## 2012-09-04 LAB — CBC WITH DIFFERENTIAL/PLATELET
Basophils Relative: 0 % (ref 0–1)
Eosinophils Absolute: 0.2 10*3/uL (ref 0.0–0.7)
MCH: 29.5 pg (ref 26.0–34.0)
MCHC: 34 g/dL (ref 30.0–36.0)
Neutrophils Relative %: 65 % (ref 43–77)
Platelets: 342 10*3/uL (ref 150–400)

## 2012-09-04 LAB — HEMOGLOBIN A1C: Hgb A1c MFr Bld: 6.1 % — ABNORMAL HIGH (ref <5.7)

## 2012-09-05 ENCOUNTER — Telehealth: Payer: Self-pay | Admitting: Family

## 2012-09-05 DIAGNOSIS — D72829 Elevated white blood cell count, unspecified: Secondary | ICD-10-CM

## 2012-09-05 MED ORDER — CIPROFLOXACIN HCL 500 MG PO TABS: 500.0000 mg | ORAL_TABLET | Freq: Two times a day (BID) | ORAL | Status: DC

## 2012-09-05 NOTE — Telephone Encounter (Signed)
Pls call pt- sugar appears well controlled.  Reviewed [facility] records.  Showed possible UTI.  White blood cell count elevated.  I would like her to take cipro for urinary tract infection and repeat cbc with diff in 2 weeks, dx is leukocytosis.

## 2012-09-05 NOTE — Telephone Encounter (Signed)
Notified pt and she voices understanding. Future lab order printed and copy given to the lab.

## 2012-09-30 ENCOUNTER — Other Ambulatory Visit: Payer: Self-pay | Admitting: Family

## 2012-09-30 NOTE — Telephone Encounter (Signed)
Request for diovan hct 80-12.5mg  denied with note sent to pharmacy to check refill on file from 08/25/12. Directions are correct with 08/25/12 rx.

## 2012-10-01 ENCOUNTER — Telehealth: Payer: Self-pay | Admitting: Family

## 2012-10-01 NOTE — Telephone Encounter (Signed)
Called and spoke with Kristin Herrera, he states nothing is needed at this time.

## 2012-10-01 NOTE — Telephone Encounter (Signed)
TaShawn from [facility] pharmacy called stating that she has questions regarding refill on Diovan

## 2012-10-02 ENCOUNTER — Telehealth: Payer: Self-pay | Admitting: *Deleted

## 2012-10-02 DIAGNOSIS — D72829 Elevated white blood cell count, unspecified: Secondary | ICD-10-CM

## 2012-10-02 NOTE — Telephone Encounter (Signed)
Pt presented to the lab for CBC and requested that hepatitis B titers be obtained as her school is requesting documentation. Printed pt a copy of her immunization report that documents dates of vaccine and asked her to verify if school will take documentation. Extra blood has been drawn and placed on storage in case titers will be required. Pt will call me back tomorrow with outcome.

## 2012-10-03 NOTE — Telephone Encounter (Signed)
Left detailed message on cell# to let us know if school will accept prior documentation or if blood needs to be sent for titer.

## 2012-10-13 NOTE — Telephone Encounter (Signed)
Pt did not return my call. Sample was discarded last week.

## 2012-10-22 ENCOUNTER — Emergency Department (HOSPITAL_BASED_OUTPATIENT_CLINIC_OR_DEPARTMENT_OTHER)
Admission: EM | Admit: 2012-10-22 | Discharge: 2012-10-22 | Disposition: A | Discharge status: Home / Self Care | Payer: PRIVATE HEALTH INSURANCE | Attending: Emergency Medicine | Admitting: Emergency Medicine

## 2012-10-22 ENCOUNTER — Encounter (HOSPITAL_BASED_OUTPATIENT_CLINIC_OR_DEPARTMENT_OTHER): Payer: Self-pay

## 2012-10-22 ENCOUNTER — Emergency Department (HOSPITAL_BASED_OUTPATIENT_CLINIC_OR_DEPARTMENT_OTHER): Payer: PRIVATE HEALTH INSURANCE

## 2012-10-22 DIAGNOSIS — R1031 Right lower quadrant pain: Secondary | ICD-10-CM | POA: Insufficient documentation

## 2012-10-22 DIAGNOSIS — Z3202 Encounter for pregnancy test, result negative: Secondary | ICD-10-CM | POA: Insufficient documentation

## 2012-10-22 DIAGNOSIS — Z8679 Personal history of other diseases of the circulatory system: Secondary | ICD-10-CM | POA: Insufficient documentation

## 2012-10-22 DIAGNOSIS — Z9851 Tubal ligation status: Secondary | ICD-10-CM | POA: Insufficient documentation

## 2012-10-22 DIAGNOSIS — Z8632 Personal history of gestational diabetes: Secondary | ICD-10-CM | POA: Insufficient documentation

## 2012-10-22 DIAGNOSIS — Z79899 Other long term (current) drug therapy: Secondary | ICD-10-CM | POA: Insufficient documentation

## 2012-10-22 DIAGNOSIS — I10 Essential (primary) hypertension: Secondary | ICD-10-CM | POA: Insufficient documentation

## 2012-10-22 DIAGNOSIS — R109 Unspecified abdominal pain: Secondary | ICD-10-CM

## 2012-10-22 DIAGNOSIS — Z87891 Personal history of nicotine dependence: Secondary | ICD-10-CM | POA: Insufficient documentation

## 2012-10-22 DIAGNOSIS — Z8719 Personal history of other diseases of the digestive system: Secondary | ICD-10-CM | POA: Insufficient documentation

## 2012-10-22 DIAGNOSIS — R110 Nausea: Secondary | ICD-10-CM | POA: Insufficient documentation

## 2012-10-22 LAB — URINALYSIS, ROUTINE W REFLEX MICROSCOPIC
Glucose, UA: NEGATIVE mg/dL
Hgb urine dipstick: NEGATIVE
Ketones, ur: 15 mg/dL — AB
Protein, ur: NEGATIVE mg/dL

## 2012-10-22 LAB — CBC WITH DIFFERENTIAL/PLATELET
Basophils Absolute: 0 10*3/uL (ref 0.0–0.1)
Eosinophils Relative: 0 % (ref 0–5)
HCT: 39.2 % (ref 36.0–46.0)
Hemoglobin: 12.8 g/dL (ref 12.0–15.0)
Lymphocytes Relative: 5 % — ABNORMAL LOW (ref 12–46)
MCV: 88.5 fL (ref 78.0–100.0)
Monocytes Absolute: 0.5 10*3/uL (ref 0.1–1.0)
Monocytes Relative: 5 % (ref 3–12)
RDW: 12.8 % (ref 11.5–15.5)
WBC: 11.2 10*3/uL — ABNORMAL HIGH (ref 4.0–10.5)

## 2012-10-22 LAB — COMPREHENSIVE METABOLIC PANEL
AST: 14 U/L (ref 0–37)
BUN: 15 mg/dL (ref 6–23)
CO2: 27 mEq/L (ref 19–32)
Calcium: 8.9 mg/dL (ref 8.4–10.5)
Creatinine, Ser: 0.8 mg/dL (ref 0.50–1.10)
GFR calc Af Amer: >90 mL/min (ref >90)
GFR calc non Af Amer: >90 mL/min (ref >90)
Glucose, Bld: 117 mg/dL — ABNORMAL HIGH (ref 70–99)
Total Bilirubin: 0.5 mg/dL (ref 0.3–1.2)

## 2012-10-22 LAB — LIPASE, BLOOD: Lipase: 38 U/L (ref 11–59)

## 2012-10-22 MED ORDER — IOHEXOL 300 MG/ML  SOLN
100.0000 mL | Freq: Once | INTRAMUSCULAR | Status: AC | PRN
  Administered 2012-10-22: 100 mL via INTRAVENOUS

## 2012-10-22 MED ORDER — IOHEXOL 300 MG/ML  SOLN
50.0000 mL | Freq: Once | INTRAMUSCULAR | Status: AC | PRN
  Administered 2012-10-22: 50 mL via ORAL

## 2012-10-22 MED ORDER — HYDROMORPHONE HCL PF 1 MG/ML IJ SOLN
1.0000 mg | Freq: Once | INTRAMUSCULAR | Status: AC
  Administered 2012-10-22: 1 mg via INTRAVENOUS
  Filled 2012-10-22: qty 1

## 2012-10-22 MED ORDER — ONDANSETRON 8 MG PO TBDP: 8.0000 mg | ORAL_TABLET | Freq: Three times a day (TID) | ORAL | Status: DC | PRN

## 2012-10-22 MED ORDER — SODIUM CHLORIDE 0.9 % IV BOLUS (SEPSIS)
1000.0000 mL | Freq: Once | INTRAVENOUS | Status: AC
  Administered 2012-10-22: 1000 mL via INTRAVENOUS

## 2012-10-22 MED ORDER — ONDANSETRON HCL 4 MG/2ML IJ SOLN
4.0000 mg | Freq: Once | INTRAMUSCULAR | Status: AC
  Administered 2012-10-22: 4 mg via INTRAVENOUS
  Filled 2012-10-22: qty 2

## 2012-10-22 NOTE — ED Notes (Signed)
Pt reports onset of abdominal cramping and nausea this am.

## 2012-10-22 NOTE — ED Provider Notes (Signed)
History     CSN: [id number]  Arrival date & time 10/22/12  1610   First MD Initiated Contact with Patient 10/22/12 1637      Chief Complaint  Patient presents with  . Nausea  . Abdominal Cramping    (Consider location/radiation/quality/duration/timing/severity/associated sxs/prior treatment) HPI 42 y.o female with nausea began this a.m. Then crampy diffuse abdominal pain.  No vomiting, diarrhea or fever, frequency, dysuria, vaginal discharge.  Patient denies previous similar symptoms.  Patient states had diverticulitis when seen in ed 2-3 years ago which may have been similar.  Pain waxes and wanes- no increasing factors, some positions hurt more.  No meds for this.  Last bm Monday and normal.  Periods regular with last menses two weeks ago normal period at normal time.  S?P b tubal ligation.  Patient has not had food and only ice for fluids.  pmd Peggyann Juba.  Past Medical History  Diagnosis Date  . Hypertension   . Frequent episodic tension-type headache   . Diverticulitis   . Gestational diabetes     Past Surgical History  Procedure Laterality Date  . Cesarean section  1991, 1999  . Tubal ligation  1999    Family History  Problem Relation Age of Onset  . Hypertension Mother   . Heart disease Father   . Heart disease Paternal Uncle   . Hypertension Maternal Grandmother   . Diabetes Maternal Grandmother   . Stroke Paternal Grandmother   . Hypertension Paternal Grandmother   . Diabetes Paternal Grandmother   . Heart disease Paternal Grandfather     History  Substance Use Topics  . Smoking status: Former Smoker -- 10 years    Types: Cigarettes    Quit date: 03/10/2010  . Smokeless tobacco: Never Used  . Alcohol Use: No    OB History   Grav Para Term Preterm Abortions TAB SAB Ect Mult Living                  Review of Systems  All other systems reviewed and are negative.    Allergies  Review of patient's allergies indicates no known allergies.  Home  Medications   Current Outpatient Rx  Name  Route  Sig  Dispense  Refill  . levonorgestrel (MIRENA) 20 MCG/24HR IUD   Intrauterine   1 each by Intrauterine route once.         . valsartan-hydrochlorothiazide (DIOVAN HCT) 80-12.5 MG per tablet   Oral   Take 0.5 tablets by mouth daily.   15 tablet   4     BP 128/89  Pulse 108  Temp(Src) 98.7 F (37.1 C) (Oral)  Resp 20  Ht 5\' 2"  (1.575 m)  Wt 210 lb (95.255 kg)  BMI 38.4 kg/m2  SpO2 100%  LMP 10/08/2012  Physical Exam  Nursing note and vitals reviewed. Constitutional: She appears well-developed and well-nourished.  HENT:  Head: Normocephalic and atraumatic.  Eyes: Conjunctivae and EOM are normal. Pupils are equal, round, and reactive to light.  Neck: Normal range of motion. Neck supple.  Cardiovascular: Normal rate, regular rhythm, normal heart sounds and intact distal pulses.   Pulmonary/Chest: Effort normal and breath sounds normal.  Abdominal: Soft. Bowel sounds are normal.  Right side abdominal tenderness with most pain rlq and some rebound  Musculoskeletal: Normal range of motion.  Neurological: She is alert.  Skin: Skin is warm and dry.  Psychiatric: She has a normal mood and affect. Thought content normal.    ED Course  Procedures (including critical care time)  Labs Reviewed  URINALYSIS, ROUTINE W REFLEX MICROSCOPIC - Abnormal; Notable for the following:    Ketones, ur 15 (*)    All other components within normal limits  PREGNANCY, URINE   No results found.   No diagnosis found.    MDM  Patient improved. Abdomen is soft and no longer tender. I reviewed the patient's lab findings and CT results with her. She's advised return if she is worse at any time especially if she has worsening pain, fever, or inability to keep down medications. She'll be discharged with Zofran. She is advised clear liquids for the next 12 hours. She's advised followup with primary care physician as she is not completely better  tomorrow.      Hilario Quarry, MD 10/22/12 2027

## 2012-12-01 ENCOUNTER — Ambulatory Visit (INDEPENDENT_AMBULATORY_CARE_PROVIDER_SITE_OTHER): Payer: PRIVATE HEALTH INSURANCE | Admitting: Family

## 2012-12-01 ENCOUNTER — Ambulatory Visit: Payer: PRIVATE HEALTH INSURANCE | Admitting: Family

## 2012-12-01 DIAGNOSIS — Z0289 Encounter for other administrative examinations: Secondary | ICD-10-CM

## 2012-12-01 DIAGNOSIS — I10 Essential (primary) hypertension: Secondary | ICD-10-CM

## 2012-12-01 NOTE — Progress Notes (Signed)
  Subjective:    Patient ID: Kristin Herrera, female    DOB: 03/14/1971, 42 y.o.   MRN: [id number]  HPI    Review of Systems     Objective:   Physical Exam        Assessment & Plan:  Pt no showed.

## 2012-12-18 ENCOUNTER — Ambulatory Visit: Payer: Self-pay | Admitting: Family

## 2013-03-24 ENCOUNTER — Other Ambulatory Visit: Payer: Self-pay | Admitting: Family

## 2013-03-24 NOTE — Telephone Encounter (Signed)
Rx request to pharmacy/[facility]  

## 2013-04-13 ENCOUNTER — Emergency Department (HOSPITAL_BASED_OUTPATIENT_CLINIC_OR_DEPARTMENT_OTHER)
Admission: EM | Admit: 2013-04-13 | Discharge: 2013-04-14 | Disposition: A | Discharge status: Home / Self Care | Payer: PRIVATE HEALTH INSURANCE | Attending: Emergency Medicine | Admitting: Emergency Medicine

## 2013-04-13 ENCOUNTER — Emergency Department (HOSPITAL_BASED_OUTPATIENT_CLINIC_OR_DEPARTMENT_OTHER): Payer: PRIVATE HEALTH INSURANCE

## 2013-04-13 ENCOUNTER — Encounter (HOSPITAL_BASED_OUTPATIENT_CLINIC_OR_DEPARTMENT_OTHER): Payer: Self-pay | Admitting: *Deleted

## 2013-04-13 DIAGNOSIS — Z8632 Personal history of gestational diabetes: Secondary | ICD-10-CM | POA: Insufficient documentation

## 2013-04-13 DIAGNOSIS — R0602 Shortness of breath: Secondary | ICD-10-CM | POA: Insufficient documentation

## 2013-04-13 DIAGNOSIS — Z87891 Personal history of nicotine dependence: Secondary | ICD-10-CM | POA: Insufficient documentation

## 2013-04-13 DIAGNOSIS — Z8719 Personal history of other diseases of the digestive system: Secondary | ICD-10-CM | POA: Insufficient documentation

## 2013-04-13 DIAGNOSIS — I10 Essential (primary) hypertension: Secondary | ICD-10-CM | POA: Insufficient documentation

## 2013-04-13 DIAGNOSIS — R0789 Other chest pain: Secondary | ICD-10-CM | POA: Insufficient documentation

## 2013-04-13 LAB — BASIC METABOLIC PANEL
BUN: 18 mg/dL (ref 6–23)
Chloride: 105 mEq/L (ref 96–112)
Creatinine, Ser: 0.9 mg/dL (ref 0.50–1.10)
GFR calc Af Amer: >90 mL/min (ref >90)

## 2013-04-13 LAB — CBC WITH DIFFERENTIAL/PLATELET
Basophils Relative: 0 % (ref 0–1)
Eosinophils Absolute: 0.1 10*3/uL (ref 0.0–0.7)
Eosinophils Relative: 1 % (ref 0–5)
HCT: 36.4 % (ref 36.0–46.0)
MCH: 27.9 pg (ref 26.0–34.0)
MCHC: 32.7 g/dL (ref 30.0–36.0)
MCV: 85.2 fL (ref 78.0–100.0)
Monocytes Absolute: 0.8 10*3/uL (ref 0.1–1.0)
Monocytes Relative: 7 % (ref 3–12)
RBC: 4.27 MIL/uL (ref 3.87–5.11)
WBC: 11.1 10*3/uL — ABNORMAL HIGH (ref 4.0–10.5)

## 2013-04-13 NOTE — ED Provider Notes (Signed)
CSN: [id number]     Arrival date & time 04/13/13  2103 History     This chart was scribed for No att. providers found by Jiles Prows, ED Scribe. The patient was seen in room MH04/MH04 and the patient's care was started at 10:28 PM.   Chief Complaint  Patient presents with  . Chest Pain   The history is provided by the patient and medical records. No language interpreter was used.   HPI Comments: Kristin Herrera is a 42 y.o. female with a h/o HTN who presents to the Emergency Department complaining of waxing and waning, constant, burning pain to chest onset Saturday.  She rates this pain at a 7/10 at it's worst.  She reports she is feeling no pain now, but states if she moves the pain rates a 3-4/10.  Pt reports she woke up on Saturday with pain in her chest.  She initially wrote it off as heartburn, but states it has yet to subside.  She states that Sunday night the pain moved to her left shoulder.  She states this pain was non-radiating and constant since Saturday.  She states that movement exacerbates the pain (specifically deep breaths and bending over).  She states that sitting still makes it better.  She describes associated SOB.  Pt denies headache, diaphoresis, fever, chills, nausea, vomiting, diarrhea, weakness, cough, h/o blood clot, and any other pain. She denies ever having pain like this before.  Pt states that lately she has gotten very hot when walking into work and states that it takes about 30 minutes to cool down once inside.    Pt reports a few days before this began her right calf was very tight.  She states this pain has since resolved.  Pt reports that her doctor stated she was "borderline" diabetic.  She states her family has a h/o heart attack.  She denies alcohol and smoking (she quit smoking 4-5 years ago).  Past Medical History  Diagnosis Date  . Hypertension   . Frequent episodic tension-type headache   . Diverticulitis   . Gestational diabetes    Past Surgical  History  Procedure Laterality Date  . Cesarean section  1991, 1999  . Tubal ligation  1999   Family History  Problem Relation Age of Onset  . Hypertension Mother   . Heart disease Father   . Heart disease Paternal Uncle   . Hypertension Maternal Grandmother   . Diabetes Maternal Grandmother   . Stroke Paternal Grandmother   . Hypertension Paternal Grandmother   . Diabetes Paternal Grandmother   . Heart disease Paternal Grandfather    History  Substance Use Topics  . Smoking status: Former Smoker -- 10 years    Types: Cigarettes    Quit date: 03/10/2010  . Smokeless tobacco: Never Used  . Alcohol Use: No   OB History   Grav Para Term Preterm Abortions TAB SAB Ect Mult Living                 Review of Systems  Constitutional: Negative for chills, activity change and appetite change.  HENT: Negative for congestion, sore throat, facial swelling, rhinorrhea, neck pain and neck stiffness.   Eyes: Negative for photophobia and discharge.  Respiratory: Negative for chest tightness.   Cardiovascular: Negative for leg swelling.  Gastrointestinal: Negative for diarrhea.  Endocrine: Negative for polydipsia and polyuria.  Genitourinary: Negative for dysuria, frequency, difficulty urinating and pelvic pain.  Musculoskeletal: Negative for arthralgias.  Skin: Negative for color  change and wound.  Allergic/Immunologic: Negative for immunocompromised state.  Neurological: Negative for facial asymmetry.  Hematological: Does not bruise/bleed easily.  Psychiatric/Behavioral: Negative for confusion and agitation.   Allergies  Review of patient's allergies indicates no known allergies.  Home Medications   Current Outpatient Rx  Name  Route  Sig  Dispense  Refill  . levonorgestrel (MIRENA) 20 MCG/24HR IUD   Intrauterine   1 each by Intrauterine route once.         . ondansetron (ZOFRAN ODT) 8 MG disintegrating tablet   Oral   Take 1 tablet (8 mg total) by mouth every 8 (eight)  hours as needed for nausea.   20 tablet   0   . ranitidine (ZANTAC) 150 MG tablet   Oral   Take 1 tablet (150 mg total) by mouth 2 (two) times daily.   60 tablet   0   . valsartan-hydrochlorothiazide (DIOVAN-HCT) 80-12.5 MG per tablet      TAKE 1/2 TABLET BY MOUTH DAILY   15 tablet   4    BP 154/99  Pulse 74  Temp(Src) 97.8 F (36.6 C) (Oral)  Resp 16  Ht 5\' 2"  (1.575 m)  Wt 209 lb (94.802 kg)  BMI 38.22 kg/m2  SpO2 100%  LMP 04/07/2013 Physical Exam  Nursing note and vitals reviewed. Constitutional: She is oriented to person, place, and time. She appears well-developed and well-nourished. No distress.  HENT:  Head: Normocephalic.  Mouth/Throat: Oropharynx is clear and moist.  Eyes: Pupils are equal, round, and reactive to light.  Neck: Neck supple.  Cardiovascular: Normal rate, regular rhythm and normal heart sounds.  Exam reveals no gallop and no friction rub.   No murmur heard. Pulmonary/Chest: Effort normal and breath sounds normal. No respiratory distress. She has no wheezes. She has no rales.  Abdominal: Soft. Bowel sounds are normal. She exhibits no distension. There is no tenderness. There is no rebound and no guarding.  Musculoskeletal: She exhibits no edema and no tenderness.  Neurological: She is alert and oriented to person, place, and time.  Skin: Skin is warm and dry.  Psychiatric: She has a normal mood and affect.   ED Course   Procedures (including critical care time) DIAGNOSTIC STUDIES: Filed Vitals:   04/13/13 2109  BP: 154/99  Pulse: 74  Temp: 97.8 F (36.6 C)  TempSrc: Oral  Resp: 16  Height: 5\' 2"  (1.575 m)  Weight: 209 lb (94.802 kg)  SpO2: 100%   COORDINATION OF CARE: 10:35 PM - Discussed ED treatment with pt at bedside including d-dimer and blood work and pt agrees.   Labs Reviewed  CBC WITH DIFFERENTIAL - Abnormal; Notable for the following:    WBC 11.1 (*)    Hemoglobin 11.9 (*)    All other components within normal limits   BASIC METABOLIC PANEL - Abnormal; Notable for the following:    Glucose, Bld 117 (*)    GFR calc non Af Amer 79 (*)    All other components within normal limits  TROPONIN I  D-DIMER, QUANTITATIVE    Troponin I (Final result)  Component (Lab Inquiry)     Collection Time Result Time TROPONIN I    04/13/13 22:57:00 04/13/13 23:31:02 <0.30 Due to the release kinetics of cTnI, a negative result within the first hours of the onset of symptoms does not rule out myocardial infarction with certainty. If myocardial infarction is still suspected, repeat the test at appropriate intervals.  D-dimer, quantitative (Final result)  Component (Lab Inquiry)     Collection Time Result Time D-Dimer, Quant    04/13/13 22:57:00 04/13/13 23:14:05 <0.27 AT THE INHOUSE ESTABLISHED CUTOFF VALUE OF 0.48 ug/mL FEU, THIS ASSAY HAS BEEN DOCUMENTED IN THE LITERATURE TO HAVE A SENSITIVITY AND NEGATIVE PREDICTIVE VALUE OF AT LEAST 98 TO 99%. THE TEST RESULT SHOULD BE CORRELATED WITH AN ASSESSMENT OF THE CLINICAL PROBABILITY OF DVT / VTE.            Dg Chest 2 View  04/13/2013   *RADIOLOGY REPORT*  Clinical Data: Chest pain and left shoulder pain.  CHEST - 2 VIEW  Comparison: 02/24/2011  Findings: The heart size and pulmonary vascularity are normal and the lungs are clear.  No osseous abnormality.  IMPRESSION: Normal chest.   Original Report Authenticated By: Francene Boyers, M.D.   1. Non-cardiac chest pain     MDM   Pt is a 42 y.o. female with Pmhx as above who presents with 2 days of constant CP, worse w/ leaning forward, mvmt, deep breathing.  EKG unremarkable, TIMI 0, PERC negative. CXR unremarkable, trop & d-dimer not elevated.  Doubt cardiac chest pain, ACS, pna, PE, dissection.  Pt more likely has costochondritis given pain w/ mvmt & deep breathing, or GERD given pain is burning and worse when leaning forward.  Will recommend she follow closely with PCP this week, trial of zantac & NSAIDs at  home.  Return precautions given for new or worsening symptoms including worsening pain, SOB, fever, cough, leg swelling.     1. Non-cardiac chest pain     I personally performed the services described in this documentation, which was scribed in my presence. The recorded information has been reviewed and is accurate.   Shanna Cisco, MD 04/14/13 1046

## 2013-04-13 NOTE — ED Notes (Signed)
Pt c/o chest pain and SOB  x 3 days

## 2013-04-14 ENCOUNTER — Telehealth: Payer: Self-pay | Admitting: Family

## 2013-04-14 MED ORDER — RANITIDINE HCL 150 MG PO TABS: 150.0000 mg | ORAL_TABLET | Freq: Two times a day (BID) | ORAL | Status: DC

## 2013-04-14 NOTE — Telephone Encounter (Signed)
Left detailed message informing patient to call our office to schedule ED follow up. °

## 2013-04-14 NOTE — Telephone Encounter (Signed)
Please contact pt and arrange a 1-2 week ED follow up.

## 2013-06-08 ENCOUNTER — Encounter: Payer: PRIVATE HEALTH INSURANCE | Admitting: Family

## 2013-06-10 ENCOUNTER — Telehealth: Payer: Self-pay | Admitting: *Deleted

## 2013-06-10 ENCOUNTER — Encounter: Payer: Self-pay | Admitting: Family

## 2013-06-10 ENCOUNTER — Ambulatory Visit (INDEPENDENT_AMBULATORY_CARE_PROVIDER_SITE_OTHER): Payer: PRIVATE HEALTH INSURANCE | Admitting: Family

## 2013-06-10 VITALS — BP 118/88 | HR 70 | Temp 97.9°F | Resp 16 | Ht 63.0 in | Wt 203.1 lb

## 2013-06-10 DIAGNOSIS — I10 Essential (primary) hypertension: Secondary | ICD-10-CM

## 2013-06-10 DIAGNOSIS — Z0000 Encounter for general adult medical examination without abnormal findings: Secondary | ICD-10-CM

## 2013-06-10 DIAGNOSIS — R7309 Other abnormal glucose: Secondary | ICD-10-CM

## 2013-06-10 DIAGNOSIS — R7303 Prediabetes: Secondary | ICD-10-CM

## 2013-06-10 LAB — CBC WITH DIFFERENTIAL/PLATELET
Eosinophils Relative: 1 % (ref 0–5)
HCT: 39.7 % (ref 36.0–46.0)
Lymphocytes Relative: 24 % (ref 12–46)
Lymphs Abs: 2.5 10*3/uL (ref 0.7–4.0)
MCV: 80.7 fL (ref 78.0–100.0)
Monocytes Absolute: 0.5 10*3/uL (ref 0.1–1.0)
Monocytes Relative: 5 % (ref 3–12)
RBC: 4.92 MIL/uL (ref 3.87–5.11)
RDW: 15.5 % (ref 11.5–15.5)
WBC: 10.3 10*3/uL (ref 4.0–10.5)

## 2013-06-10 LAB — HEPATIC FUNCTION PANEL
ALT: 12 U/L (ref 0–35)
AST: 15 U/L (ref 0–37)
Bilirubin, Direct: 0.1 mg/dL (ref 0.0–0.3)
Indirect Bilirubin: 0.4 mg/dL (ref 0.0–0.9)

## 2013-06-10 LAB — LIPID PANEL
HDL: 56 mg/dL (ref >39)
LDL Cholesterol: 120 mg/dL — ABNORMAL HIGH (ref 0–99)
Total CHOL/HDL Ratio: 3.3 Ratio

## 2013-06-10 LAB — BASIC METABOLIC PANEL WITH GFR
CO2: 25 mEq/L (ref 19–32)
Chloride: 101 mEq/L (ref 96–112)
Creat: 0.77 mg/dL (ref 0.50–1.10)

## 2013-06-10 LAB — HEMOGLOBIN A1C: Mean Plasma Glucose: 137 mg/dL — ABNORMAL HIGH (ref <117)

## 2013-06-10 LAB — TSH: TSH: 1.315 u[IU]/mL (ref 0.350–4.500)

## 2013-06-10 MED ORDER — NYSTATIN 100000 UNIT/GM EX POWD: Freq: Four times a day (QID) | CUTANEOUS | Status: DC

## 2013-06-10 NOTE — Progress Notes (Signed)
Subjective:    Patient ID: Kristin Herrera, female    DOB: Jul 09, 1971, 42 y.o.   MRN: [id number]  HPI  Kristin Herrera is a 42 yr old female who presents today for cpx.  Immunizations: will get flu shot with her job Diet: reports healthy diet.  Exercise: 30 minutes 3 times a week. Pap Smear: reports last pap 1 year ago.  Mammogram:  Would like to schedule for november     Review of Systems  Constitutional: Negative for unexpected weight change.  HENT: Negative for rhinorrhea.   Eyes: Negative for visual disturbance.  Respiratory: Negative for cough and shortness of breath.   Cardiovascular: Negative for chest pain.  Gastrointestinal: Negative for nausea, vomiting and diarrhea.  Genitourinary: Negative for dysuria, frequency and menstrual problem.  Musculoskeletal: Negative for arthralgias and myalgias.       Occasional left shoulder discomfort- intermittent  Skin:       Notes some itching beneath her breasts.  Neurological: Negative for headaches.  Hematological: Negative for adenopathy.  Psychiatric/Behavioral:       Denies depression/anxiety   Past Medical History  Diagnosis Date  . Hypertension   . Frequent episodic tension-type headache   . Diverticulitis   . Gestational diabetes     History   Social History  . Marital Status: Married    Spouse Name: N/A    Number of Children: 2  . Years of Education: N/A   Occupational History  . HOUSEHOLD COORDINATOR    Social History Main Topics  . Smoking status: Former Smoker -- 10 years    Types: Cigarettes    Quit date: 03/10/2010  . Smokeless tobacco: Never Used  . Alcohol Use: No  . Drug Use: No  . Sexual Activity: Not on file   Other Topics Concern  . 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     Complete 12 th grade               Past Surgical History  Procedure Laterality Date  . Cesarean section  1991, 1999  . Tubal ligation  1999    Family History  Problem Relation Age of Onset  . Hypertension Mother   . Heart disease Father   . Heart disease Paternal Uncle   . Hypertension Maternal Grandmother   . Diabetes Maternal Grandmother   . Stroke Paternal Grandmother   . Hypertension Paternal Grandmother   . Diabetes Paternal Grandmother   . Heart disease Paternal Grandfather     No Known Allergies  Current Outpatient Prescriptions on File Prior to Visit  Medication Sig Dispense Refill  . levonorgestrel (MIRENA) 20 MCG/24HR IUD 1 each by Intrauterine route once.      . ondansetron (ZOFRAN ODT) 8 MG disintegrating tablet Take 1 tablet (8 mg total) by mouth every 8 (eight) hours as needed for nausea.  20 tablet  0  . ranitidine (ZANTAC) 150 MG tablet Take 1 tablet (150 mg total) by mouth 2 (two) times daily.  60 tablet  0  . valsartan-hydrochlorothiazide (DIOVAN-HCT) 80-12.5 MG per tablet TAKE 1/2 TABLET BY MOUTH DAILY  15 tablet  4   No current facility-administered medications on file prior to visit.    BP 118/88  Pulse 70  Temp(Src) 97.9 F (36.6 C) (Oral)  Resp 16  Ht 5\' 3"  (1.6 m)  Wt 203 lb 1.9 oz (92.135 kg)  BMI 35.99 kg/m2  SpO2 99%  LMP 05/21/2013       Objective:   Physical Exam Physical Exam  Constitutional: She is oriented to person, place, and time. She appears well-developed and well-nourished. No distress.  HENT:  Head: Normocephalic and atraumatic.  Right Ear: Tympanic membrane and ear canal normal.  Left Ear: Tympanic membrane and ear canal normal.  Mouth/Throat: Oropharynx is clear and moist.  Eyes: Pupils are equal, round, and reactive to light. No scleral icteruNeurological: She is alert and oriented to person, place, and time. She has normal reflexes. She exhibits normal muscle tone. Coordination normal. s.  Neck: Normal range of motion. No thyromegaly  present.  Cardiovascular: Normal rate and regular rhythm.   No murmur heard. Pulmonary/Chest: Effort normal and breath sounds normal. No respiratory distress. He has no wheezes. She has no rales. She exhibits no tenderness.  Abdominal: Soft. Bowel sounds are normal. He exhibits no distension and no mass. There is no tenderness. There is no rebound and no guarding.  Musculoskeletal: She exhibits no edema.  Lymphadenopathy:    She has no cervical adenopathy.  Skin: Skin is warm and dry.  Psychiatric: She has a normal mood and affect. Her behavior is normal. Judgment and thought content normal.  Breasts: Examined lying Right: Without masses, retractions, discharge or axillary adenopathy.  Left: Without masses, retractions, discharge or axillary adenopathy.  Pelvic: deferred to GYN    Assessment & Plan:          Assessment & Plan:

## 2013-06-10 NOTE — Assessment & Plan Note (Signed)
We discussed importance of diabetic diet, exercise, weight loss. Check A1C

## 2013-06-10 NOTE — Telephone Encounter (Signed)
Should be bid please.

## 2013-06-10 NOTE — Assessment & Plan Note (Signed)
BP stable on current meds. Continue same.  

## 2013-06-10 NOTE — Telephone Encounter (Signed)
Received message from [facility] pharmacy requesting clarification of directions for Nystatin. Rx states qid and bid. How should pt use it? Pt would like to pick up Rx tomorrow.

## 2013-06-10 NOTE — Patient Instructions (Signed)
Please complete your lab work prior to leaving. Continue to work on [employer], exercise and weight loss.   Follow up in 4 months.

## 2013-06-10 NOTE — Assessment & Plan Note (Signed)
We discussed healthy diet, exercise and weight loss. Obtain fasting lab work.

## 2013-06-11 ENCOUNTER — Other Ambulatory Visit: Payer: Self-pay | Admitting: Family

## 2013-06-11 DIAGNOSIS — E785 Hyperlipidemia, unspecified: Secondary | ICD-10-CM

## 2013-06-11 LAB — URINALYSIS, ROUTINE W REFLEX MICROSCOPIC
Bilirubin Urine: NEGATIVE
Glucose, UA: NEGATIVE mg/dL
Hgb urine dipstick: NEGATIVE
Ketones, ur: NEGATIVE mg/dL
Nitrite: NEGATIVE
Protein, ur: NEGATIVE mg/dL
pH: 5.5 (ref 5.0–8.0)

## 2013-06-11 MED ORDER — NYSTATIN 100000 UNIT/GM EX POWD: Freq: Two times a day (BID) | CUTANEOUS | Status: DC

## 2013-06-11 NOTE — Telephone Encounter (Signed)
Pharmacy informed, understood; corrected in med list/SLS

## 2013-06-11 NOTE — Telephone Encounter (Signed)
Sugar is still showing borderline diabetes.  LDL "bad cholesterol" is above goal.  I would like her to add simvastatin 10mg  once daily. Plan to repeat flp/lft in 3 months. Call if she develops unusual muscle pain after starting.

## 2013-06-16 MED ORDER — SIMVASTATIN 10 MG PO TABS: 10.0000 mg | ORAL_TABLET | Freq: Every day | ORAL | Status: DC

## 2013-06-16 NOTE — Telephone Encounter (Signed)
Notified pt and she voices understanding. Future order entered. 

## 2013-06-26 ENCOUNTER — Ambulatory Visit (HOSPITAL_BASED_OUTPATIENT_CLINIC_OR_DEPARTMENT_OTHER)
Admission: RE | Admit: 2013-06-26 | Discharge: 2013-06-26 | Disposition: A | Discharge status: Home / Self Care | Payer: PRIVATE HEALTH INSURANCE | Source: Ambulatory Visit | Attending: Family | Admitting: Family

## 2013-06-26 DIAGNOSIS — Z1231 Encounter for screening mammogram for malignant neoplasm of breast: Secondary | ICD-10-CM | POA: Insufficient documentation

## 2013-08-04 ENCOUNTER — Ambulatory Visit (HOSPITAL_COMMUNITY): Payer: PRIVATE HEALTH INSURANCE | Admitting: Psychiatry

## 2013-09-02 ENCOUNTER — Ambulatory Visit (HOSPITAL_COMMUNITY): Payer: PRIVATE HEALTH INSURANCE | Admitting: Psychiatry

## 2013-09-21 ENCOUNTER — Ambulatory Visit (INDEPENDENT_AMBULATORY_CARE_PROVIDER_SITE_OTHER): Payer: PRIVATE HEALTH INSURANCE | Admitting: Family

## 2013-09-21 ENCOUNTER — Encounter: Payer: Self-pay | Admitting: Family

## 2013-09-21 VITALS — BP 104/88 | HR 90 | Temp 98.7°F | Resp 16 | Ht 63.0 in | Wt 208.0 lb

## 2013-09-21 DIAGNOSIS — R0789 Other chest pain: Secondary | ICD-10-CM

## 2013-09-21 DIAGNOSIS — M25519 Pain in unspecified shoulder: Secondary | ICD-10-CM

## 2013-09-21 NOTE — Assessment & Plan Note (Addendum)
Suspect musculoskeletal pain- Patient works as a Hydrologist and often lifts patients.  EKG in the office is unchanged since prior.  We did discuss possibility of stress test.  At this point since EKG is normal and unchanged and she has not had any chest pain since last summer (was atypical at that time) will hold off on stress test.  Pt aware and agrees to plan.

## 2013-09-21 NOTE — Progress Notes (Signed)
Pre visit review using our clinic review tool, if applicable. No additional management support is needed unless otherwise documented below in the visit note. 

## 2013-09-21 NOTE — Progress Notes (Signed)
Subjective:    Patient ID: Kristin Herrera, female    DOB: 1970/09/10, 43 y.o.   MRN: [id number]  HPI [name] is a 43 year old female who presents today with intermittent, dull, achy, non radiating, left shoulder pain since this morning upon waking. Patient has felt this pain several months ago. Denies nausea, vomiting, chest pain, shortness of breath, and recent injury. Patient reports nausea three days ago with intermittent dizziness and headache. Patient reports taking ibuprofen and tylenol for headaches with relief. Patient reports her blood pressure has been elevated at home lately. She did have a visit to the ED over the summer with atypical chest pain. Cardiac work up was negative.  She denies any chest pain since that time.   BP Readings from Last 3 Encounters:  09/21/13 104/88  06/10/13 118/88  04/13/13 154/99      Review of Systems  Constitutional: Positive for fatigue.       Reports recent fatigue which she attributes to starting a new night shift job.  HENT: Positive for sinus pressure. Negative for ear pain.   Respiratory: Negative for cough and shortness of breath.   Cardiovascular: Positive for chest pain. Negative for palpitations.  Gastrointestinal: Positive for nausea. Negative for vomiting.  Neurological: Positive for dizziness and headaches.       Past Medical History  Diagnosis Date  . Hypertension   . Frequent episodic tension-type headache   . Diverticulitis   . Gestational diabetes     History   Social History  . Marital Status: Married    Spouse Name: N/A    Number of Children: 2  . Years of Education: N/A   Occupational History  . HOUSEHOLD COORDINATOR    Social History Main Topics  . Smoking status: Former Smoker -- 10 years    Types: Cigarettes    Quit date: 03/10/2010  . Smokeless tobacco: Never Used  . Alcohol Use: No  . Drug Use: No  . Sexual Activity: Not on file   Other Topics Concern  . Not on file   Social History  Narrative   Caffeine Use:  1 daily   Regular exercise:  No   Married   2 children Son age 35 and daughter age 3   Works at [employer] as a Nurse, learning disability.   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     Complete 12 th grade                Past Surgical History  Procedure Laterality Date  . Cesarean section  1991, 1999  . Tubal ligation  1999    Family History  Problem Relation Age of Onset  . Hypertension Mother   . Heart disease Father   . Heart disease Paternal Uncle   . Hypertension Maternal Grandmother   . Diabetes Maternal Grandmother   . Stroke Paternal Grandmother   . Hypertension Paternal Grandmother   . Diabetes Paternal Grandmother   . Heart disease Paternal Grandfather     No Known Allergies  Current Outpatient Prescriptions on File Prior to Visit  Medication Sig Dispense Refill  . levonorgestrel (MIRENA) 20 MCG/24HR IUD 1 each by Intrauterine route once.      . nystatin (MYCOSTATIN) powder Apply topically 2 (two) times daily. Apply twice daily beneath breasts  30 g  0  . simvastatin (ZOCOR) 10 MG tablet Take 1 tablet (10 mg total) by mouth at bedtime.  30 tablet  2  . valsartan-hydrochlorothiazide (DIOVAN-HCT) 80-12.5 MG per  tablet TAKE 1/2 TABLET BY MOUTH DAILY  15 tablet  4   No current facility-administered medications on file prior to visit.    BP 104/88  Pulse 90  Temp(Src) 98.7 F (37.1 C) (Oral)  Resp 16  Ht 5\' 3"  (1.6 m)  Wt 208 lb (94.348 kg)  BMI 36.85 kg/m2  SpO2 98%    Objective:   Physical Exam  Constitutional: She is oriented to person, place, and time. She appears well-nourished.  HENT:  Head: Normocephalic.  Cardiovascular: Normal rate, regular rhythm, S1 normal, S2 normal and normal heart sounds.  PMI is not displaced.   Pulses:      Radial pulses are 2+ on the right side, and 2+ on the left side.  Pulmonary/Chest: Effort normal and breath sounds normal. No respiratory distress. She has no wheezes. She exhibits no tenderness.    Neurological: She is alert and oriented to person, place, and time.  Skin: Skin is warm and dry.  Psychiatric: She has a normal mood and affect.          Assessment & Plan:

## 2013-09-21 NOTE — Assessment & Plan Note (Deleted)
Suspect MSK. Obtain ECG today in office. Ordered Cardiac Stress Test. Follow if symptoms worsen.

## 2013-09-21 NOTE — Patient Instructions (Addendum)
Follow up if symptoms worsen or do not improve. Go to the ER for chest pain and shortness of breath. Please give [place] a call if you are not contacted about scheduling your stress test in one week.

## 2013-10-12 ENCOUNTER — Ambulatory Visit: Payer: PRIVATE HEALTH INSURANCE | Admitting: Family

## 2013-10-16 ENCOUNTER — Other Ambulatory Visit: Payer: Self-pay | Admitting: Family

## 2013-10-19 ENCOUNTER — Ambulatory Visit (INDEPENDENT_AMBULATORY_CARE_PROVIDER_SITE_OTHER): Payer: PRIVATE HEALTH INSURANCE | Admitting: Family

## 2013-10-19 ENCOUNTER — Encounter: Payer: Self-pay | Admitting: Family

## 2013-10-19 VITALS — BP 122/84 | HR 58 | Temp 97.5°F | Resp 16 | Ht 63.0 in | Wt 206.1 lb

## 2013-10-19 DIAGNOSIS — R7309 Other abnormal glucose: Secondary | ICD-10-CM

## 2013-10-19 DIAGNOSIS — I10 Essential (primary) hypertension: Secondary | ICD-10-CM

## 2013-10-19 DIAGNOSIS — E785 Hyperlipidemia, unspecified: Secondary | ICD-10-CM

## 2013-10-19 DIAGNOSIS — R7303 Prediabetes: Secondary | ICD-10-CM

## 2013-10-19 LAB — HEMOGLOBIN A1C
HEMOGLOBIN A1C: 6.3 % — AB (ref <5.7)
MEAN PLASMA GLUCOSE: 134 mg/dL — AB (ref <117)

## 2013-10-19 NOTE — Assessment & Plan Note (Signed)
BP Readings from Last 3 Encounters:  10/19/13 122/84  09/21/13 104/88  06/10/13 118/88

## 2013-10-19 NOTE — Patient Instructions (Signed)
Please complete your lab work prior to leaving. Follow up in 3 months.   

## 2013-10-19 NOTE — Progress Notes (Signed)
Subjective:    Patient ID: Kristin Herrera, female    DOB: [birth year], 43 y.o.   MRN: [id number]  Hypertension Pertinent negatives include no chest pain, headaches or palpitations.   [name] is a 43 year old female who presents today for follow up.  Hypertension) Patient denies chest pain, shortness of breath, headache, and cough. She is maintained on diovan hct.  Borderline diabetes) Patient reports moderate exercise about once weekly and is working towards a healthy diet. Patient had elevated A1C during last visit. Denies polyuria, polydipsia, polyphagia.  Thyromegaly) Denies pain, difficulty swallowing, intolerance to cold, fatigue, change in appetite. Last TSH normal.    Review of Systems  Respiratory: Negative for cough.   Cardiovascular: Negative for chest pain, palpitations and leg swelling.  Endocrine: Negative for cold intolerance, polydipsia, polyphagia and polyuria.  Genitourinary: Negative for dysuria.  Skin: Negative for rash.  Neurological: Negative for dizziness, numbness and headaches.   Past Medical History  Diagnosis Date  . Hypertension   . Frequent episodic tension-type headache   . Diverticulitis   . Gestational diabetes     History   Social History  . Marital Status: Married    Spouse Name: N/A    Number of Children: 2  . Years of Education: N/A   Occupational History  . HOUSEHOLD COORDINATOR    Social History Main Topics  . Smoking status: Former Smoker -- 10 years    Types: Cigarettes    Quit date: 03/10/2010  . Smokeless tobacco: Never Used  . Alcohol Use: No  . Drug Use: No  . Sexual Activity: Not on file   Other Topics Concern  . Not on file   Social History Narrative   Caffeine Use:  1 daily   Regular exercise:  No   Married   2 children Son age 60 and daughter age 90   Works at [employer] as a Nurse, learning disability.   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     Complete 12 th grade                Past Surgical History    Procedure Laterality Date  . Cesarean section  1991, 1999  . Tubal ligation  1999    Family History  Problem Relation Age of Onset  . Hypertension Mother   . Heart disease Father   . Heart disease Paternal Uncle   . Hypertension Maternal Grandmother   . Diabetes Maternal Grandmother   . Stroke Paternal Grandmother   . Hypertension Paternal Grandmother   . Diabetes Paternal Grandmother   . Heart disease Paternal Grandfather     No Known Allergies  Current Outpatient Prescriptions on File Prior to Visit  Medication Sig Dispense Refill  . levonorgestrel (MIRENA) 20 MCG/24HR IUD 1 each by Intrauterine route once.      . nystatin (MYCOSTATIN) powder Apply topically 2 (two) times daily. Apply twice daily beneath breasts  30 g  0  . simvastatin (ZOCOR) 10 MG tablet Take 1 tablet (10 mg total) by mouth at bedtime.  30 tablet  2  . valsartan-hydrochlorothiazide (DIOVAN-HCT) 80-12.5 MG per tablet TAKE 1/2 TABLET BY MOUTH DAILY  15 tablet  4   No current facility-administered medications on file prior to visit.    BP 122/84  Pulse 58  Temp(Src) 97.5 F (36.4 C) (Oral)  Resp 16  Ht 5\' 3"  (1.6 m)  Wt 206 lb 1.9 oz (93.495 kg)  BMI 36.52 kg/m2  SpO2 99%  LMP 10/02/2013  Objective:   Physical Exam  Constitutional: She is oriented to person, place, and time. She appears well-developed and well-nourished.  HENT:  Head: Normocephalic.  Cardiovascular: Normal rate, regular rhythm, S1 normal, S2 normal and normal heart sounds.   Pulses:      Radial pulses are 2+ on the right side, and 2+ on the left side.  Pulmonary/Chest: Effort normal and breath sounds normal.  Neurological: She is alert and oriented to person, place, and time.  Skin: Skin is warm and dry.  Psychiatric: She has a normal mood and affect.          Assessment & Plan:  I have personally seen and examined patient and agree with Kristin Friendly NP-student's assessment and plan.

## 2013-10-19 NOTE — Progress Notes (Signed)
Pre visit review using our clinic review tool, if applicable. No additional management support is needed unless otherwise documented below in the visit note. 

## 2013-10-19 NOTE — Assessment & Plan Note (Signed)
Obtain A1c, urine microalbumin, discussed diet, exercise, weight loss.

## 2013-10-19 NOTE — Assessment & Plan Note (Signed)
Continue statin, obtain flp/LFT

## 2013-10-20 ENCOUNTER — Telehealth: Payer: Self-pay | Admitting: Family

## 2013-10-20 DIAGNOSIS — E785 Hyperlipidemia, unspecified: Secondary | ICD-10-CM

## 2013-10-20 LAB — LIPID PANEL
Cholesterol: 179 mg/dL (ref 0–200)
HDL: 50 mg/dL (ref >39)
LDL Cholesterol: 119 mg/dL — ABNORMAL HIGH (ref 0–99)
Total CHOL/HDL Ratio: 3.6 Ratio
Triglycerides: 52 mg/dL (ref <150)
VLDL: 10 mg/dL (ref 0–40)

## 2013-10-20 LAB — BASIC METABOLIC PANEL WITH GFR
BUN: 12 mg/dL (ref 6–23)
CALCIUM: 9.1 mg/dL (ref 8.4–10.5)
CO2: 22 meq/L (ref 19–32)
Chloride: 100 mEq/L (ref 96–112)
Creat: 0.76 mg/dL (ref 0.50–1.10)
GFR, Est African American: >89 mL/min
GFR, Est Non African American: >89 mL/min
Glucose, Bld: 87 mg/dL (ref 70–99)
POTASSIUM: 4.8 meq/L (ref 3.5–5.3)
SODIUM: 135 meq/L (ref 135–145)

## 2013-10-20 LAB — HEPATIC FUNCTION PANEL
ALBUMIN: 4.1 g/dL (ref 3.5–5.2)
ALK PHOS: 72 U/L (ref 39–117)
ALT: 11 U/L (ref 0–35)
AST: 20 U/L (ref 0–37)
BILIRUBIN TOTAL: 0.5 mg/dL (ref 0.2–1.2)
Bilirubin, Direct: 0.1 mg/dL (ref 0.0–0.3)
Indirect Bilirubin: 0.4 mg/dL (ref 0.2–1.2)
Total Protein: 7.4 g/dL (ref 6.0–8.3)

## 2013-10-20 LAB — MICROALBUMIN / CREATININE URINE RATIO
Creatinine, Urine: 239.5 mg/dL
Microalb Creat Ratio: 5 mg/g (ref 0.0–30.0)
Microalb, Ur: 1.19 mg/dL (ref 0.00–1.89)

## 2013-10-20 MED ORDER — SIMVASTATIN 20 MG PO TABS: 20.0000 mg | ORAL_TABLET | Freq: Every day | ORAL | Status: DC

## 2013-10-20 NOTE — Telephone Encounter (Signed)
Please call pt and let her know that her sugar is elevated but at goal. LDL "bad cholesterol" is above goal. Increase simvastatin from 10 to 20mg .  Repeat FLP/LFT in 6 weeks.

## 2013-10-20 NOTE — Telephone Encounter (Signed)
Relevant patient education assigned to patient using Emmi. ° °

## 2013-10-21 NOTE — Telephone Encounter (Signed)
Notified pt and she voices understanding. Lab order entered. 

## 2013-10-22 ENCOUNTER — Telehealth: Payer: Self-pay | Admitting: Family

## 2013-10-22 DIAGNOSIS — E785 Hyperlipidemia, unspecified: Secondary | ICD-10-CM

## 2013-10-22 MED ORDER — SIMVASTATIN 40 MG PO TABS: 40.0000 mg | ORAL_TABLET | Freq: Every day | ORAL | Status: DC

## 2013-10-22 NOTE — Telephone Encounter (Signed)
Sugar looks good. Cholesterol above goal. Increase simvastatin from 20mg  to 40 mg.  Repeat flp/lft 6 weeks dx hyperlipidemia.

## 2013-10-26 NOTE — Telephone Encounter (Signed)
Notified pt she voices understanding. Lab order entered.

## 2013-12-15 ENCOUNTER — Encounter: Payer: Self-pay | Admitting: Family

## 2013-12-15 LAB — LIPID PANEL
CHOL/HDL RATIO: 2.9 ratio
Cholesterol: 140 mg/dL (ref 0–200)
HDL: 48 mg/dL (ref >39)
LDL Cholesterol: 81 mg/dL (ref 0–99)
Triglycerides: 57 mg/dL (ref <150)
VLDL: 11 mg/dL (ref 0–40)

## 2013-12-15 LAB — HEPATIC FUNCTION PANEL
ALBUMIN: 4 g/dL (ref 3.5–5.2)
ALT: 15 U/L (ref 0–35)
AST: 18 U/L (ref 0–37)
Alkaline Phosphatase: 67 U/L (ref 39–117)
BILIRUBIN TOTAL: 0.4 mg/dL (ref 0.2–1.2)
Bilirubin, Direct: 0.1 mg/dL (ref 0.0–0.3)
Indirect Bilirubin: 0.3 mg/dL (ref 0.2–1.2)
Total Protein: 6.8 g/dL (ref 6.0–8.3)

## 2014-01-04 ENCOUNTER — Ambulatory Visit (INDEPENDENT_AMBULATORY_CARE_PROVIDER_SITE_OTHER): Payer: PRIVATE HEALTH INSURANCE | Admitting: Family

## 2014-01-04 ENCOUNTER — Encounter: Payer: Self-pay | Admitting: Family

## 2014-01-04 VITALS — BP 110/80 | HR 73 | Temp 98.2°F | Resp 16 | Ht 63.0 in | Wt 202.1 lb

## 2014-01-04 DIAGNOSIS — Z23 Encounter for immunization: Secondary | ICD-10-CM

## 2014-01-04 DIAGNOSIS — R7303 Prediabetes: Secondary | ICD-10-CM

## 2014-01-04 DIAGNOSIS — I10 Essential (primary) hypertension: Secondary | ICD-10-CM

## 2014-01-04 DIAGNOSIS — R7309 Other abnormal glucose: Secondary | ICD-10-CM

## 2014-01-04 DIAGNOSIS — E785 Hyperlipidemia, unspecified: Secondary | ICD-10-CM

## 2014-01-04 NOTE — Addendum Note (Signed)
Addended by: Kelle Darting A on: 01/04/2014 09:39 AM   Modules accepted: Orders

## 2014-01-04 NOTE — Assessment & Plan Note (Signed)
Discussed weight loss using my fitness pal. Commended pt for her recent increase in exercise.

## 2014-01-04 NOTE — Progress Notes (Signed)
Pre visit review using our clinic review tool, if applicable. No additional management support is needed unless otherwise documented below in the visit note. 

## 2014-01-04 NOTE — Assessment & Plan Note (Signed)
At goal on simvastatin.

## 2014-01-04 NOTE — Progress Notes (Signed)
Subjective:    Patient ID: Kristin Herrera, female    DOB: February 22, 1971, 43 y.o.   MRN: [id number]  HPI  [name] is a 43 yr old female who presents today for follow up of multiple medical problems.  1) HTN- she is currently maintianed on diovan-HCT  2) Borderline Diabetes- 11/15. Has recently started walking. Lab Results  Component Value Date   HGBA1C 6.3* 10/19/2013   3) Hyperlipidemia- maintained on simvastatin.   Lab Results  Component Value Date   CHOL 140 12/14/2013   HDL 48 12/14/2013   LDLCALC 81 12/14/2013   TRIG 57 12/14/2013   CHOLHDL 2.9 12/14/2013      Review of Systems See HPI  Past Medical History  Diagnosis Date  . Hypertension   . Frequent episodic tension-type headache   . Diverticulitis   . Gestational diabetes     History   Social History  . Marital Status: Married    Spouse Name: N/A    Number of Children: 2  . Years of Education: N/A   Occupational History  . HOUSEHOLD COORDINATOR    Social History Main Topics  . Smoking status: Former Smoker -- 10 years    Types: Cigarettes    Quit date: 03/10/2010  . Smokeless tobacco: Never Used  . Alcohol Use: No  . Drug Use: No  . Sexual Activity: Not on file   Other Topics Concern  . Not on file   Social History Narrative   Caffeine Use:  1 daily   Regular exercise:  No   Married   2 children Son age 84 and daughter age 50   Works at [employer] as a Nurse, learning disability.   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     Complete 12 th grade                Past Surgical History  Procedure Laterality Date  . Cesarean section  1991, 1999  . Tubal ligation  1999    Family History  Problem Relation Age of Onset  . Hypertension Mother   . Heart disease Father   . Heart disease Paternal Uncle   . Hypertension Maternal Grandmother   . Diabetes Maternal Grandmother   . Stroke Paternal Grandmother   . Hypertension Paternal Grandmother   . Diabetes Paternal Grandmother   . Heart disease  Paternal Grandfather     No Known Allergies  Current Outpatient Prescriptions on File Prior to Visit  Medication Sig Dispense Refill  . levonorgestrel (MIRENA) 20 MCG/24HR IUD 1 each by Intrauterine route once.      . nystatin (MYCOSTATIN) powder Apply topically 2 (two) times daily. Apply twice daily beneath breasts  30 g  0  . valsartan-hydrochlorothiazide (DIOVAN-HCT) 80-12.5 MG per tablet TAKE 1/2 TABLET BY MOUTH DAILY  15 tablet  4   No current facility-administered medications on file prior to visit.    BP 110/80  Pulse 73  Temp(Src) 98.2 F (36.8 C) (Oral)  Resp 16  Ht 5\' 3"  (1.6 m)  Wt 202 lb 1.9 oz (91.681 kg)  BMI 35.81 kg/m2  SpO2 99%  LMP 01/04/2014       Objective:   Physical Exam  Constitutional: She is oriented to person, place, and time. She appears well-developed and well-nourished. No distress.  Cardiovascular: Normal rate and regular rhythm.   No murmur heard. Pulmonary/Chest: Effort normal and breath sounds normal. No respiratory distress. She has no wheezes. She has no rales. She exhibits no tenderness.  Musculoskeletal: She exhibits no edema.  Neurological: She is alert and oriented to person, place, and time.  Psychiatric: She has a normal mood and affect. Her behavior is normal. Judgment and thought content normal.          Assessment & Plan:

## 2014-01-04 NOTE — Assessment & Plan Note (Signed)
Stable on current meds.  Continue same. 

## 2014-01-04 NOTE — Patient Instructions (Signed)
Download Myfitness Pal.  Please follow up in 3 months.

## 2014-01-05 ENCOUNTER — Ambulatory Visit: Payer: PRIVATE HEALTH INSURANCE | Admitting: Family

## 2014-04-09 ENCOUNTER — Ambulatory Visit: Payer: PRIVATE HEALTH INSURANCE | Admitting: Family

## 2014-04-20 ENCOUNTER — Ambulatory Visit (INDEPENDENT_AMBULATORY_CARE_PROVIDER_SITE_OTHER): Payer: PRIVATE HEALTH INSURANCE | Admitting: Family

## 2014-04-20 ENCOUNTER — Encounter: Payer: Self-pay | Admitting: Family

## 2014-04-20 VITALS — BP 158/100 | HR 67 | Temp 98.3°F | Resp 16 | Ht 63.0 in | Wt 201.1 lb

## 2014-04-20 DIAGNOSIS — R7309 Other abnormal glucose: Secondary | ICD-10-CM

## 2014-04-20 DIAGNOSIS — E785 Hyperlipidemia, unspecified: Secondary | ICD-10-CM

## 2014-04-20 DIAGNOSIS — I10 Essential (primary) hypertension: Secondary | ICD-10-CM

## 2014-04-20 DIAGNOSIS — R7303 Prediabetes: Secondary | ICD-10-CM

## 2014-04-20 MED ORDER — AMLODIPINE BESYLATE 5 MG PO TABS: 5.0000 mg | ORAL_TABLET | Freq: Every day | ORAL | Status: DC

## 2014-04-20 NOTE — Assessment & Plan Note (Signed)
BP is elevated today.  Add amlodipine.  Follow up in 2 weeks today for BP recheck.  Plan bmet next visit.

## 2014-04-20 NOTE — Assessment & Plan Note (Signed)
Discussed diet and exercise, plan A1C next visit.

## 2014-04-20 NOTE — Assessment & Plan Note (Signed)
Tolerating statin.  Continue same, plan flp/lft next visit.

## 2014-04-20 NOTE — Progress Notes (Signed)
Pre visit review using our clinic review tool, if applicable. No additional management support is needed unless otherwise documented below in the visit note. 

## 2014-04-20 NOTE — Progress Notes (Signed)
Subjective:    Patient ID: Kristin Herrera, female    DOB: 1970-10-04, 43 y.o.   MRN: [id number]  HPI  [name] is a 43 yr old female who presents today for follow up of multiple medical probolems:  1) HTN- she is currently maintained on diovan hct. Took AM dose today. Denies CP, sob or swelling.    2) Hyperlipidemia- maintianed on zocor. Most recent lipid panel was at goal in April. Denies myalgia. Lab Results  Component Value Date   CHOL 140 12/14/2013   HDL 48 12/14/2013   LDLCALC 81 12/14/2013   TRIG 57 12/14/2013   CHOLHDL 2.9 12/14/2013   3) Borderline DM-  Trying to watch her diet. She has not been exercising regularly.,  Lab Results  Component Value Date   HGBA1C 6.3* 10/19/2013      Review of Systems    see HPI  Past Medical History  Diagnosis Date  . Hypertension   . Frequent episodic tension-type headache   . Diverticulitis   . Gestational diabetes     History   Social History  . Marital Status: Married    Spouse Name: N/A    Number of Children: 2  . Years of Education: N/A   Occupational History  . HOUSEHOLD COORDINATOR    Social History Main Topics  . Smoking status: Former Smoker -- 10 years    Types: Cigarettes    Quit date: 03/10/2010  . Smokeless tobacco: Never Used  . Alcohol Use: No  . Drug Use: No  . Sexual Activity: Not on file   Other Topics Concern  . Not on file   Social History Narrative   Caffeine Use:  1 daily   Regular exercise:  No   Married   2 children Son age 30 and daughter age 40   Works at [employer] as a Nurse, learning disability.   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     Complete 12 th grade                Past Surgical History  Procedure Laterality Date  . Cesarean section  1991, 1999  . Tubal ligation  1999    Family History  Problem Relation Age of Onset  . Hypertension Mother   . Heart disease Father   . Heart disease Paternal Uncle   . Hypertension Maternal Grandmother   . Diabetes Maternal  Grandmother   . Stroke Paternal Grandmother   . Hypertension Paternal Grandmother   . Diabetes Paternal Grandmother   . Heart disease Paternal Grandfather     No Known Allergies  Current Outpatient Prescriptions on File Prior to Visit  Medication Sig Dispense Refill  . levonorgestrel (MIRENA) 20 MCG/24HR IUD 1 each by Intrauterine route once.      . nystatin (MYCOSTATIN) powder Apply topically 2 (two) times daily. Apply twice daily beneath breasts  30 g  0  . simvastatin (ZOCOR) 20 MG tablet Take 20 mg by mouth daily.      . valsartan-hydrochlorothiazide (DIOVAN-HCT) 80-12.5 MG per tablet TAKE 1/2 TABLET BY MOUTH DAILY  15 tablet  4   No current facility-administered medications on file prior to visit.    Pulse 67  Temp(Src) 98.3 F (36.8 C) (Oral)  Resp 16  Ht 5\' 3"  (1.6 m)  Wt 201 lb 1.3 oz (91.209 kg)  BMI 35.63 kg/m2  SpO2 99%  LMP 04/18/2014    Objective:   Physical Exam  Constitutional: She is oriented to person, place, and  time. She appears well-developed and well-nourished. No distress.  Cardiovascular: Normal rate and regular rhythm.   No murmur heard. Pulmonary/Chest: Effort normal and breath sounds normal. No respiratory distress. She has no wheezes. She has no rales. She exhibits no tenderness.  Neurological: She is alert and oriented to person, place, and time.  Psychiatric: She has a normal mood and affect. Her behavior is normal. Thought content normal.          Assessment & Plan:

## 2014-04-20 NOTE — Patient Instructions (Addendum)
Start amlodipine once daily. Continue diovan hct. Follow up in 2 weeks.

## 2014-05-05 ENCOUNTER — Telehealth: Payer: Self-pay | Admitting: *Deleted

## 2014-05-05 ENCOUNTER — Ambulatory Visit: Payer: PRIVATE HEALTH INSURANCE | Admitting: Family

## 2014-05-05 NOTE — Telephone Encounter (Signed)
Pt did not show for appointment today, 05/05/2014, at 8:30am for 2 week follow up.

## 2014-05-05 NOTE — Telephone Encounter (Signed)
Please contact pt to reschedule

## 2014-05-06 NOTE — Telephone Encounter (Signed)
I contacted pt to reschedule.  She forgot about her appointment, her granddaughter past away.  She will call to reschedule.

## 2014-05-06 NOTE — Telephone Encounter (Signed)
Kristin Herrera, could you please make sure she does not get a no show charge? thanks

## 2014-06-04 ENCOUNTER — Other Ambulatory Visit: Payer: Self-pay | Admitting: Family

## 2014-06-04 NOTE — Telephone Encounter (Signed)
Rx request to pharmacy/[facility]  

## 2014-06-11 ENCOUNTER — Encounter: Payer: Self-pay | Admitting: Family

## 2014-06-11 ENCOUNTER — Ambulatory Visit (INDEPENDENT_AMBULATORY_CARE_PROVIDER_SITE_OTHER): Payer: PRIVATE HEALTH INSURANCE | Admitting: Family

## 2014-06-11 VITALS — BP 124/85 | HR 73 | Temp 98.7°F | Resp 16 | Ht 63.0 in | Wt 204.6 lb

## 2014-06-11 DIAGNOSIS — Z0000 Encounter for general adult medical examination without abnormal findings: Secondary | ICD-10-CM

## 2014-06-11 DIAGNOSIS — R7303 Prediabetes: Secondary | ICD-10-CM

## 2014-06-11 DIAGNOSIS — I10 Essential (primary) hypertension: Secondary | ICD-10-CM

## 2014-06-11 DIAGNOSIS — E785 Hyperlipidemia, unspecified: Secondary | ICD-10-CM

## 2014-06-11 DIAGNOSIS — R7309 Other abnormal glucose: Secondary | ICD-10-CM

## 2014-06-11 LAB — BASIC METABOLIC PANEL
BUN: 14 mg/dL (ref 6–23)
CHLORIDE: 104 meq/L (ref 96–112)
CO2: 29 meq/L (ref 19–32)
Calcium: 9.1 mg/dL (ref 8.4–10.5)
Creatinine, Ser: 1.1 mg/dL (ref 0.4–1.2)
GFR: 73.16 mL/min (ref >60.00)
Glucose, Bld: 154 mg/dL — ABNORMAL HIGH (ref 70–99)
Potassium: 4.1 mEq/L (ref 3.5–5.1)
Sodium: 137 mEq/L (ref 135–145)

## 2014-06-11 LAB — HEMOGLOBIN A1C: Hgb A1c MFr Bld: 6 % (ref 4.6–6.5)

## 2014-06-11 LAB — HEPATIC FUNCTION PANEL
ALK PHOS: 77 U/L (ref 39–117)
ALT: 14 U/L (ref 0–35)
AST: 13 U/L (ref 0–37)
Albumin: 3.2 g/dL — ABNORMAL LOW (ref 3.5–5.2)
BILIRUBIN DIRECT: 0 mg/dL (ref 0.0–0.3)
TOTAL PROTEIN: 7.9 g/dL (ref 6.0–8.3)
Total Bilirubin: 0.5 mg/dL (ref 0.2–1.2)

## 2014-06-11 MED ORDER — SIMVASTATIN 20 MG PO TABS: 20.0000 mg | ORAL_TABLET | Freq: Every day | ORAL | Status: DC

## 2014-06-11 MED ORDER — AMLODIPINE BESYLATE 5 MG PO TABS: ORAL_TABLET | ORAL | Status: DC

## 2014-06-11 NOTE — Assessment & Plan Note (Signed)
On statin, obtain follow up LFT.

## 2014-06-11 NOTE — Assessment & Plan Note (Signed)
Improved, continue current meds 

## 2014-06-11 NOTE — Patient Instructions (Signed)
Complete lab work prior to leaving. Continue current meds. Schedule mammogram on the first floor. Please schedule a follow up appointment in 3 months.

## 2014-06-11 NOTE — Progress Notes (Signed)
Pre visit review using our clinic review tool, if applicable. No additional management support is needed unless otherwise documented below in the visit note. 

## 2014-06-11 NOTE — Assessment & Plan Note (Signed)
Obtain follow up A1C.   

## 2014-06-11 NOTE — Progress Notes (Signed)
Subjective:    Patient ID: Kristin Herrera, female    DOB: 1970/11/10, 43 y.o.   MRN: [id number]  HPI  Kristin Herrera is a 43 yr old female who presents today for follow up of her blood pressure.   1) HTN-  Last visit amlodipine was added to her regimen.  Denies CP/SOB or swelling.  Reports + compliance.   BP Readings from Last 3 Encounters:  06/11/14 124/85  04/20/14 158/100  01/04/14 110/80     Review of Systems See HPI  Past Medical History  Diagnosis Date  . Hypertension   . Frequent episodic tension-type headache   . Diverticulitis   . Gestational diabetes     History   Social History  . Marital Status: Married    Spouse Name: N/A    Number of Children: 2  . Years of Education: N/A   Occupational History  . HOUSEHOLD COORDINATOR    Social History Main Topics  . Smoking status: Former Smoker -- 10 years    Types: Cigarettes    Quit date: 03/10/2010  . Smokeless tobacco: Never Used  . Alcohol Use: No  . Drug Use: No  . Sexual Activity: Not on file   Other Topics Concern  . Not on file   Social History Narrative   Caffeine Use:  1 daily   Regular exercise:  No   Married   2 children Son age 33 and daughter age 72   Works at [employer] as a Nurse, learning disability.   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     Complete 12 th grade                Past Surgical History  Procedure Laterality Date  . Cesarean section  1991, 1999  . Tubal ligation  1999    Family History  Problem Relation Age of Onset  . Hypertension Mother   . Heart disease Father   . Heart disease Paternal Uncle   . Hypertension Maternal Grandmother   . Diabetes Maternal Grandmother   . Stroke Paternal Grandmother   . Hypertension Paternal Grandmother   . Diabetes Paternal Grandmother   . Heart disease Paternal Grandfather     No Known Allergies  Current Outpatient Prescriptions on File Prior to Visit  Medication Sig Dispense Refill  . amLODipine (NORVASC) 5 MG tablet TAKE 1  TABLET (5 MG TOTAL) BY MOUTH DAILY.  30 tablet  2  . levonorgestrel (MIRENA) 20 MCG/24HR IUD 1 each by Intrauterine route once.      . nystatin (MYCOSTATIN) powder Apply topically 2 (two) times daily. Apply twice daily beneath breasts  30 g  0  . simvastatin (ZOCOR) 20 MG tablet Take 20 mg by mouth daily.      . valsartan-hydrochlorothiazide (DIOVAN-HCT) 80-12.5 MG per tablet TAKE 1/2 TABLET BY MOUTH DAILY  15 tablet  2   No current facility-administered medications on file prior to visit.    BP 124/85  Pulse 73  Temp(Src) 98.7 F (37.1 C) (Oral)  Resp 16  Ht 5\' 3"  (1.6 m)  Wt 204 lb 9.6 oz (92.806 kg)  BMI 36.25 kg/m2  SpO2 100%  LMP 05/31/2014       Objective:   Physical Exam  Constitutional: She is oriented to person, place, and time. She appears well-developed and well-nourished. No distress.  Cardiovascular: Normal rate and regular rhythm.   No murmur heard. Pulmonary/Chest: Effort normal and breath sounds normal. No respiratory distress. She has no wheezes. She has  no rales. She exhibits no tenderness.  Neurological: She is alert and oriented to person, place, and time.  Psychiatric: She has a normal mood and affect. Her behavior is normal. Judgment and thought content normal.          Assessment & Plan:

## 2014-07-05 ENCOUNTER — Ambulatory Visit (HOSPITAL_BASED_OUTPATIENT_CLINIC_OR_DEPARTMENT_OTHER): Payer: PRIVATE HEALTH INSURANCE

## 2014-09-24 ENCOUNTER — Ambulatory Visit: Payer: PRIVATE HEALTH INSURANCE | Admitting: Family

## 2014-10-05 ENCOUNTER — Encounter: Payer: Self-pay | Admitting: Family

## 2014-10-05 ENCOUNTER — Ambulatory Visit (INDEPENDENT_AMBULATORY_CARE_PROVIDER_SITE_OTHER): Payer: PRIVATE HEALTH INSURANCE | Admitting: Family

## 2014-10-05 ENCOUNTER — Ambulatory Visit (HOSPITAL_BASED_OUTPATIENT_CLINIC_OR_DEPARTMENT_OTHER)
Admission: RE | Admit: 2014-10-05 | Discharge: 2014-10-05 | Disposition: A | Discharge status: Home / Self Care | Payer: PRIVATE HEALTH INSURANCE | Source: Ambulatory Visit | Attending: Family | Admitting: Family

## 2014-10-05 VITALS — BP 128/82 | HR 70 | Temp 98.1°F | Resp 16 | Ht 63.0 in | Wt 202.0 lb

## 2014-10-05 DIAGNOSIS — Z1231 Encounter for screening mammogram for malignant neoplasm of breast: Secondary | ICD-10-CM | POA: Diagnosis not present

## 2014-10-05 DIAGNOSIS — E785 Hyperlipidemia, unspecified: Secondary | ICD-10-CM

## 2014-10-05 DIAGNOSIS — I10 Essential (primary) hypertension: Secondary | ICD-10-CM

## 2014-10-05 DIAGNOSIS — Z0000 Encounter for general adult medical examination without abnormal findings: Secondary | ICD-10-CM

## 2014-10-05 DIAGNOSIS — R7309 Other abnormal glucose: Secondary | ICD-10-CM

## 2014-10-05 DIAGNOSIS — R7303 Prediabetes: Secondary | ICD-10-CM

## 2014-10-05 DIAGNOSIS — R739 Hyperglycemia, unspecified: Secondary | ICD-10-CM

## 2014-10-05 LAB — BASIC METABOLIC PANEL
BUN: 17 mg/dL (ref 6–23)
CALCIUM: 9 mg/dL (ref 8.4–10.5)
CHLORIDE: 106 meq/L (ref 96–112)
CO2: 26 mEq/L (ref 19–32)
Creatinine, Ser: 0.86 mg/dL (ref 0.40–1.20)
GFR: 92.98 mL/min (ref >60.00)
Glucose, Bld: 96 mg/dL (ref 70–99)
Potassium: 3.9 mEq/L (ref 3.5–5.1)
Sodium: 137 mEq/L (ref 135–145)

## 2014-10-05 LAB — LIPID PANEL
Cholesterol: 167 mg/dL (ref 0–200)
HDL: 55.7 mg/dL (ref >39.00)
LDL Cholesterol: 100 mg/dL — ABNORMAL HIGH (ref 0–99)
NonHDL: 111.3
TRIGLYCERIDES: 56 mg/dL (ref 0.0–149.0)
Total CHOL/HDL Ratio: 3
VLDL: 11.2 mg/dL (ref 0.0–40.0)

## 2014-10-05 LAB — HEMOGLOBIN A1C: HEMOGLOBIN A1C: 6.3 % (ref 4.6–6.5)

## 2014-10-05 NOTE — Assessment & Plan Note (Signed)
Not taking statin. Only took 1 dose and stopped due to cramping. Obtain follow up lipid panel, if high, consider pravastatin.

## 2014-10-05 NOTE — Assessment & Plan Note (Signed)
BP stable on current meds.conitnue same, obtain a1c.

## 2014-10-05 NOTE — Patient Instructions (Signed)
Please complete lab work prior to leaving. Follow up in 4 months.  

## 2014-10-05 NOTE — Progress Notes (Signed)
Subjective:    Patient ID: Kristin Herrera, female    DOB: 01-Nov-1970, 44 y.o.   MRN: [id number]  HPI  Patient here for follow up of multiple medical problems:  1)HTN- Patient is currently maintained on the following medications for blood pressure: diovan-hct, norvac Patient reports good compliance with blood pressure medications. Patient denies chest pain, shortness of breath or swelling. Last 3 blood pressure readings in our office are as follows:  BP Readings from Last 3 Encounters:  06/11/14 124/85  04/20/14 158/100  01/04/14 110/80   2) Borderline DM-  She is trying to watch her diet.   Lab Results  Component Value Date   HGBA1C 6.0 06/11/2014   3) Hyperlipidemia- Patient is currently maintained on the following medication for hyperlipidemia: none Last lipid panel as follows:   Lab Results  Component Value Date   CHOL 140 12/14/2013   HDL 48 12/14/2013   LDLCALC 81 12/14/2013   TRIG 57 12/14/2013   CHOLHDL 2.9 12/14/2013  reports that she had muscle cramping after one dose of simvastatin and stopped.  Patient reports good compliance with low fat/low cholesterol diet.       Past Medical History  Diagnosis Date  . Hypertension   . Frequent episodic tension-type headache   . Diverticulitis   . Gestational diabetes     Review of Systems See HPI  Past Medical History  Diagnosis Date  . Hypertension   . Frequent episodic tension-type headache   . Diverticulitis   . Gestational diabetes     History   Social History  . Marital Status: Married    Spouse Name: N/A    Number of Children: 2  . Years of Education: N/A   Occupational History  . HOUSEHOLD COORDINATOR    Social History Main Topics  . Smoking status: Former Smoker -- 10 years    Types: Cigarettes    Quit date: 03/10/2010  . Smokeless tobacco: Never Used  . Alcohol Use: No  . Drug Use: No  . Sexual Activity: Not on file   Other Topics Concern  . Not on file   Social History  Narrative   Caffeine Use:  1 daily   Regular exercise:  No   Married   2 children Son age 43 and daughter age 44   Works at [employer] as a Nurse, learning disability.   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     Complete 12 th grade                Past Surgical History  Procedure Laterality Date  . Cesarean section  1991, 1999  . Tubal ligation  1999    Family History  Problem Relation Age of Onset  . Hypertension Mother   . Heart disease Father   . Heart disease Paternal Uncle   . Hypertension Maternal Grandmother   . Diabetes Maternal Grandmother   . Stroke Paternal Grandmother   . Hypertension Paternal Grandmother   . Diabetes Paternal Grandmother   . Heart disease Paternal Grandfather     No Known Allergies  Current Outpatient Prescriptions on File Prior to Visit  Medication Sig Dispense Refill  . amLODipine (NORVASC) 5 MG tablet TAKE 1 TABLET (5 MG TOTAL) BY MOUTH DAILY. 90 tablet 0  . levonorgestrel (MIRENA) 20 MCG/24HR IUD 1 each by Intrauterine route once.    . nystatin (MYCOSTATIN) powder Apply topically 2 (two) times daily. Apply twice daily beneath breasts 30 g 0  . simvastatin (ZOCOR) 20 MG  tablet Take 1 tablet (20 mg total) by mouth daily. 90 tablet 1  . valsartan-hydrochlorothiazide (DIOVAN-HCT) 80-12.5 MG per tablet TAKE 1/2 TABLET BY MOUTH DAILY 15 tablet 2   No current facility-administered medications on file prior to visit.    BP 128/82 mmHg  Pulse 70  Temp(Src) 98.1 F (36.7 C) (Oral)  Resp 16  Ht 5\' 3"  (1.6 m)  Wt 202 lb (91.627 kg)  BMI 35.79 kg/m2  SpO2 99%  LMP 09/28/2014       Objective:    Physical Exam  Constitutional: She appears well-developed and well-nourished.  Cardiovascular: Normal rate, regular rhythm and normal heart sounds.   No murmur heard. Pulmonary/Chest: Effort normal and breath sounds normal. No respiratory distress. She has no wheezes.  Musculoskeletal: She exhibits no edema.  Psychiatric: She has a normal mood and  affect. Her behavior is normal. Judgment and thought content normal.    Ht 5\' 3"  (1.6 m) Wt Readings from Last 3 Encounters:  06/11/14 204 lb 9.6 oz (92.806 kg)  04/20/14 201 lb 1.3 oz (91.209 kg)  01/04/14 202 lb 1.9 oz (91.681 kg)     Lab Results  Component Value Date   WBC 10.3 06/10/2013   HGB 13.4 06/10/2013   HCT 39.7 06/10/2013   PLT 330 06/10/2013   GLUCOSE 154* 06/11/2014   CHOL 140 12/14/2013   TRIG 57 12/14/2013   HDL 48 12/14/2013   LDLCALC 81 12/14/2013   ALT 14 06/11/2014   AST 13 06/11/2014   NA 137 06/11/2014   K 4.1 06/11/2014   CL 104 06/11/2014   CREATININE 1.1 06/11/2014   BUN 14 06/11/2014   CO2 29 06/11/2014   TSH 1.315 06/10/2013   HGBA1C 6.0 06/11/2014   MICROALBUR 1.19 10/19/2013    Mm Digital Screening  06/29/2013   CLINICAL DATA:  Screening.  EXAM: DIGITAL SCREENING BILATERAL MAMMOGRAM WITH CAD  COMPARISON:  Previous exam(s).  ACR Breast Density Category b: There are scattered areas of fibroglandular density.  FINDINGS: There are no findings suspicious for malignancy. Images were processed with CAD.  IMPRESSION: No mammographic evidence of malignancy. A result letter of this screening mammogram will be mailed directly to the patient.  RECOMMENDATION: Screening mammogram in one year. (Code:SM-B-01Y)  BI-RADS CATEGORY  1: Negative   Electronically Signed   By: Lillia Mountain M.D.   On: 06/29/2013 09:18       Assessment & Plan:   Problem List Items Addressed This Visit    None       Nance Pear., NP

## 2014-10-05 NOTE — Assessment & Plan Note (Signed)
Obtain a1c, counseled pt on importance of diet/exercise/weight loss.

## 2014-10-05 NOTE — Progress Notes (Signed)
Pre visit review using our clinic review tool, if applicable. No additional management support is needed unless otherwise documented below in the visit note. 

## 2014-10-07 ENCOUNTER — Encounter: Payer: Self-pay | Admitting: Family

## 2014-11-08 ENCOUNTER — Telehealth: Payer: Self-pay | Admitting: Family

## 2014-11-08 NOTE — Telephone Encounter (Signed)
Spoke with pt and scheduled f/u for 11/10/14 at 10:30am.

## 2014-11-08 NOTE — Telephone Encounter (Signed)
Caller name:Rhodes, Anniebell Relation to FS:ELTR Call back Lyons:  Reason for call: pt thinks she is having problems with her bp meds, states she is having dizziness everytime she takes the meds and when she does not take it she does not experience any dizziness, pt will be at that number until 3:30 states she does not have a alternate number she can provide now.

## 2014-11-08 NOTE — Telephone Encounter (Signed)
Lets bring her back in for OV so we can check BP in office and adjust meds as necessary.

## 2014-11-10 ENCOUNTER — Encounter: Payer: Self-pay | Admitting: Family

## 2014-11-10 ENCOUNTER — Other Ambulatory Visit: Payer: Self-pay | Admitting: Family

## 2014-11-10 ENCOUNTER — Ambulatory Visit (INDEPENDENT_AMBULATORY_CARE_PROVIDER_SITE_OTHER): Payer: PRIVATE HEALTH INSURANCE | Admitting: Family

## 2014-11-10 VITALS — BP 126/90 | HR 77 | Temp 98.1°F | Resp 16 | Ht 63.0 in | Wt 206.6 lb

## 2014-11-10 DIAGNOSIS — I10 Essential (primary) hypertension: Secondary | ICD-10-CM

## 2014-11-10 NOTE — Progress Notes (Signed)
Subjective:    Patient ID: Kristin Herrera, female    DOB: 1970/10/03, 44 y.o.   MRN: [id number]  HPI  Kristin Herrera is a 44 yr old female who presents today with 1 week hx of dizziness. Denies spinning.  Feels like it occurs when she takes her BP meds. Reports 107/74 BP with dizziness. Has improved her BP med compliance.  She has not taken BP med in 2 days.   BP Readings from Last 3 Encounters:  11/10/14 126/90  10/05/14 128/82  06/11/14 124/85    Review of Systems    see HPI  Past Medical History  Diagnosis Date  . Hypertension   . Frequent episodic tension-type headache   . Diverticulitis   . Gestational diabetes     History   Social History  . Marital Status: Married    Spouse Name: N/A  . Number of Children: 2  . Years of Education: N/A   Occupational History  . HOUSEHOLD COORDINATOR    Social History Main Topics  . Smoking status: Former Smoker -- 10 years    Types: Cigarettes    Quit date: 03/10/2010  . Smokeless tobacco: Never Used  . Alcohol Use: No  . Drug Use: No  . Sexual Activity: Not on file   Other Topics Concern  . Not on file   Social History Narrative   Caffeine Use:  1 daily   Regular exercise:  No   Married   2 children Son age 92 and daughter age 27   Works at [employer] as a Nurse, learning disability.   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     Complete 12 th grade                Past Surgical History  Procedure Laterality Date  . Cesarean section  1991, 1999  . Tubal ligation  1999    Family History  Problem Relation Age of Onset  . Hypertension Mother   . Heart disease Father   . Heart disease Paternal Uncle   . Hypertension Maternal Grandmother   . Diabetes Maternal Grandmother   . Stroke Paternal Grandmother   . Hypertension Paternal Grandmother   . Diabetes Paternal Grandmother   . Heart disease Paternal Grandfather     Allergies  Allergen Reactions  . Simvastatin     cramping    Current Outpatient Prescriptions on  File Prior to Visit  Medication Sig Dispense Refill  . levonorgestrel (MIRENA) 20 MCG/24HR IUD 1 each by Intrauterine route once.    . nystatin (MYCOSTATIN) powder Apply topically 2 (two) times daily. Apply twice daily beneath breasts 30 g 0  . valsartan-hydrochlorothiazide (DIOVAN-HCT) 80-12.5 MG per tablet TAKE 1/2 TABLET BY MOUTH DAILY (Patient not taking: Reported on 11/10/2014) 15 tablet 2   No current facility-administered medications on file prior to visit.    BP 126/90 mmHg  Pulse 77  Temp(Src) 98.1 F (36.7 C) (Oral)  Resp 16  Ht 5\' 3"  (1.6 m)  Wt 206 lb 9.6 oz (93.713 kg)  BMI 36.61 kg/m2  SpO2 99%  LMP 10/18/2014    Objective:   Physical Exam  Constitutional: She appears well-developed and well-nourished.  Cardiovascular: Normal rate, regular rhythm and normal heart sounds.   No murmur heard. Pulmonary/Chest: Effort normal and breath sounds normal. No respiratory distress. She has no wheezes.  Psychiatric: She has a normal mood and affect. Her behavior is normal. Judgment and thought content normal.  Assessment & Plan:

## 2014-11-10 NOTE — Progress Notes (Signed)
Pre visit review using our clinic review tool, if applicable. No additional management support is needed unless otherwise documented below in the visit note. 

## 2014-11-10 NOTE — Assessment & Plan Note (Signed)
Dizziness likely related to overtreated HTN.  Advised pt:  Stop amlodipine, continue Valsartan-hct.   Check Blood pressure once daily and call if BP< 110/70. Follow up in 6 weeks.

## 2014-11-10 NOTE — Telephone Encounter (Signed)
Rx sent to the pharmacy by e-script.//AB/CMA 

## 2014-11-10 NOTE — Patient Instructions (Signed)
Stop amlodipine, continue Valsartan-hct.   Check Blood pressure once daily and call if BP< 110/70. Follow up in 6 weeks.

## 2015-04-05 ENCOUNTER — Encounter (HOSPITAL_BASED_OUTPATIENT_CLINIC_OR_DEPARTMENT_OTHER): Payer: Self-pay

## 2015-04-05 ENCOUNTER — Emergency Department (HOSPITAL_BASED_OUTPATIENT_CLINIC_OR_DEPARTMENT_OTHER)
Admission: EM | Admit: 2015-04-05 | Discharge: 2015-04-05 | Disposition: A | Discharge status: Home / Self Care | Payer: PRIVATE HEALTH INSURANCE | Attending: Physician Assistant | Admitting: Physician Assistant

## 2015-04-05 DIAGNOSIS — D72829 Elevated white blood cell count, unspecified: Secondary | ICD-10-CM | POA: Insufficient documentation

## 2015-04-05 DIAGNOSIS — Z8632 Personal history of gestational diabetes: Secondary | ICD-10-CM | POA: Diagnosis not present

## 2015-04-05 DIAGNOSIS — Z8719 Personal history of other diseases of the digestive system: Secondary | ICD-10-CM | POA: Insufficient documentation

## 2015-04-05 DIAGNOSIS — Z79899 Other long term (current) drug therapy: Secondary | ICD-10-CM | POA: Diagnosis not present

## 2015-04-05 DIAGNOSIS — Z87891 Personal history of nicotine dependence: Secondary | ICD-10-CM | POA: Diagnosis not present

## 2015-04-05 DIAGNOSIS — I10 Essential (primary) hypertension: Secondary | ICD-10-CM | POA: Insufficient documentation

## 2015-04-05 DIAGNOSIS — J029 Acute pharyngitis, unspecified: Secondary | ICD-10-CM | POA: Insufficient documentation

## 2015-04-05 DIAGNOSIS — R52 Pain, unspecified: Secondary | ICD-10-CM | POA: Diagnosis present

## 2015-04-05 DIAGNOSIS — R509 Fever, unspecified: Secondary | ICD-10-CM

## 2015-04-05 DIAGNOSIS — Z8669 Personal history of other diseases of the nervous system and sense organs: Secondary | ICD-10-CM | POA: Diagnosis not present

## 2015-04-05 LAB — CBC WITH DIFFERENTIAL/PLATELET
Basophils Absolute: 0 10*3/uL (ref 0.0–0.1)
Basophils Relative: 0 % (ref 0–1)
Eosinophils Absolute: 0 10*3/uL (ref 0.0–0.7)
Eosinophils Relative: 0 % (ref 0–5)
HEMATOCRIT: 31.4 % — AB (ref 36.0–46.0)
HEMOGLOBIN: 10 g/dL — AB (ref 12.0–15.0)
LYMPHS ABS: 1.6 10*3/uL (ref 0.7–4.0)
Lymphocytes Relative: 8 % — ABNORMAL LOW (ref 12–46)
MCH: 24.6 pg — ABNORMAL LOW (ref 26.0–34.0)
MCHC: 31.8 g/dL (ref 30.0–36.0)
MCV: 77.1 fL — AB (ref 78.0–100.0)
MONO ABS: 1.5 10*3/uL — AB (ref 0.1–1.0)
MONOS PCT: 8 % (ref 3–12)
NEUTROS PCT: 84 % — AB (ref 43–77)
Neutro Abs: 16.9 10*3/uL — ABNORMAL HIGH (ref 1.7–7.7)
Platelets: 291 10*3/uL (ref 150–400)
RBC: 4.07 MIL/uL (ref 3.87–5.11)
RDW: 15.1 % (ref 11.5–15.5)
WBC: 20.1 10*3/uL — ABNORMAL HIGH (ref 4.0–10.5)

## 2015-04-05 LAB — BASIC METABOLIC PANEL
Anion gap: 8 (ref 5–15)
BUN: 10 mg/dL (ref 6–20)
CALCIUM: 8.6 mg/dL — AB (ref 8.9–10.3)
CO2: 22 mmol/L (ref 22–32)
Chloride: 105 mmol/L (ref 101–111)
Creatinine, Ser: 0.76 mg/dL (ref 0.44–1.00)
GFR calc non Af Amer: >60 mL/min (ref >60)
Glucose, Bld: 99 mg/dL (ref 65–99)
Potassium: 3.5 mmol/L (ref 3.5–5.1)
SODIUM: 135 mmol/L (ref 135–145)

## 2015-04-05 LAB — URINE MICROSCOPIC-ADD ON

## 2015-04-05 LAB — URINALYSIS, ROUTINE W REFLEX MICROSCOPIC
Bilirubin Urine: NEGATIVE
GLUCOSE, UA: NEGATIVE mg/dL
HGB URINE DIPSTICK: NEGATIVE
KETONES UR: NEGATIVE mg/dL
Nitrite: NEGATIVE
PROTEIN: NEGATIVE mg/dL
SPECIFIC GRAVITY, URINE: 1.013 (ref 1.005–1.030)
Urobilinogen, UA: 0.2 mg/dL (ref 0.0–1.0)
pH: 6 (ref 5.0–8.0)

## 2015-04-05 LAB — I-STAT CG4 LACTIC ACID, ED: Lactic Acid, Venous: 0.67 mmol/L (ref 0.5–2.0)

## 2015-04-05 LAB — RAPID STREP SCREEN (MED CTR MEBANE ONLY): STREPTOCOCCUS, GROUP A SCREEN (DIRECT): NEGATIVE

## 2015-04-05 LAB — MONONUCLEOSIS SCREEN: Mono Screen: NEGATIVE

## 2015-04-05 MED ORDER — AMOXICILLIN 500 MG PO CAPS: 500.0000 mg | ORAL_CAPSULE | Freq: Three times a day (TID) | ORAL | Status: DC

## 2015-04-05 MED ORDER — AMOXICILLIN 500 MG PO CAPS
500.0000 mg | ORAL_CAPSULE | Freq: Once | ORAL | Status: AC
  Administered 2015-04-05: 500 mg via ORAL
  Filled 2015-04-05: qty 1

## 2015-04-05 MED ORDER — DEXAMETHASONE SODIUM PHOSPHATE 10 MG/ML IJ SOLN
10.0000 mg | Freq: Once | INTRAMUSCULAR | Status: AC
  Administered 2015-04-05: 10 mg via INTRAVENOUS
  Filled 2015-04-05: qty 1

## 2015-04-05 MED ORDER — IBUPROFEN 400 MG PO TABS
400.0000 mg | ORAL_TABLET | Freq: Once | ORAL | Status: AC
  Administered 2015-04-05: 400 mg via ORAL
  Filled 2015-04-05: qty 1

## 2015-04-05 MED ORDER — ACETAMINOPHEN 325 MG PO TABS: 650.0000 mg | ORAL_TABLET | Freq: Once | ORAL | Status: DC

## 2015-04-05 MED ORDER — SODIUM CHLORIDE 0.9 % IV BOLUS (SEPSIS)
1000.0000 mL | Freq: Once | INTRAVENOUS | Status: AC
  Administered 2015-04-05: 1000 mL via INTRAVENOUS

## 2015-04-05 NOTE — Discharge Instructions (Signed)
Return to the emergency room for any worsening or concerning symptoms including fast breathing, heart racing, confusion, vomiting.  Rest, cover your mouth when you cough and wash your hands frequently.   Push fluids: water or Gatorade, do not drink any soda, juice or caffeinated beverages.  For fever and pain control you can take Motrin (ibuprofen) as follows: 400 mg (this is normally 2 over the counter pills) every 4 hours with food.  Do not return to work until a day after your fever breaks.

## 2015-04-05 NOTE — ED Provider Notes (Signed)
CSN: [id number]     Arrival date & time 04/05/15  1234 History   First MD Initiated Contact with Patient 04/05/15 1245     Chief Complaint  Patient presents with  . Generalized Body Aches     (Consider location/radiation/quality/duration/timing/severity/associated sxs/prior Treatment) HPI  Blood pressure 149/92, pulse 104, temperature 101.5 F (38.6 C), temperature source Oral, resp. rate 18, height 5\' 2"  (1.575 m), weight 205 lb (92.987 kg), last menstrual period 03/26/2015, SpO2 99 %.  Kristin Herrera is a 44 y.o. female complaining of diffuse myalgia, severe sore throat generalized headache and nausea onset yesterday with associated fever (patient took her temperature this morning it was 100.6. She took 650 mg of acetaminophen at home with little relief. She denies cervicalgia, rash, recent travel, chest pain, cough, shortness of breath, abdominal pain, change in bowel or bladder habits, history of IV drug use.   Past Medical History  Diagnosis Date  . Hypertension   . Frequent episodic tension-type headache   . Diverticulitis   . Gestational diabetes    Past Surgical History  Procedure Laterality Date  . Cesarean section  1991, 1999  . Tubal ligation  1999   Family History  Problem Relation Age of Onset  . Hypertension Mother   . Heart disease Father   . Heart disease Paternal Uncle   . Hypertension Maternal Grandmother   . Diabetes Maternal Grandmother   . Stroke Paternal Grandmother   . Hypertension Paternal Grandmother   . Diabetes Paternal Grandmother   . Heart disease Paternal Grandfather    History  Substance Use Topics  . Smoking status: Former Smoker -- 10 years    Types: Cigarettes    Quit date: 03/10/2010  . Smokeless tobacco: Never Used  . Alcohol Use: No   OB History    No data available     Review of Systems  10 systems reviewed and found to be negative, except as noted in the HPI.   Allergies  Simvastatin  Home Medications   Prior to  Admission medications   Medication Sig Start Date End Date Taking? Authorizing Provider  amoxicillin (AMOXIL) 500 MG capsule Take 1 capsule (500 mg total) by mouth 3 (three) times daily. 04/05/15   Daine Croker, PA-C  levonorgestrel (MIRENA) 20 MCG/24HR IUD 1 each by Intrauterine route once.    Historical Provider, MD  valsartan-hydrochlorothiazide (DIOVAN-HCT) 80-12.5 MG per tablet TAKE 1/2 TABLET BY MOUTH DAILY 11/10/14   Debbrah Alar, NP   BP 129/82 mmHg  Pulse 89  Temp(Src) 98.9 F (37.2 C) (Oral)  Resp 18  Ht 5\' 2"  (1.575 m)  Wt 205 lb (92.987 kg)  BMI 37.49 kg/m2  SpO2 100%  LMP 03/26/2015 Physical Exam  Constitutional: She is oriented to person, place, and time. She appears well-developed and well-nourished. No distress.  Well-appearing, nontoxic.  HENT:  Head: Normocephalic and atraumatic.  Mouth/Throat: Oropharynx is clear and moist.  2+ tonsillar hypertrophy bilaterally with exudates bilaterally  Uvula is midline, soft palate rises symmetrically, patient is handling her secretions without issue no tenderness to palpation or elevation under tongue  Eyes: Conjunctivae and EOM are normal. Pupils are equal, round, and reactive to light.  No TTP of maxillary or frontal sinuses  No TTP or induration of temporal arteries bilaterally  Neck: Normal range of motion. Neck supple.  FROM to C-spine. Pt can touch chin to chest without discomfort. No TTP of midline cervical spine.   Cardiovascular: Normal rate, regular rhythm and intact distal pulses.  Pulmonary/Chest: Effort normal and breath sounds normal. No respiratory distress. She has no wheezes. She has no rales. She exhibits no tenderness.  Abdominal: Soft. Bowel sounds are normal. She exhibits no distension and no mass. There is no tenderness. There is no rebound and no guarding.  Musculoskeletal: Normal range of motion. She exhibits tenderness. She exhibits no edema.       Back:  Lymphadenopathy:    She has no  cervical adenopathy.  Neurological: She is alert and oriented to person, place, and time. No cranial nerve deficit.  II-Visual fields grossly intact. III/IV/VI-Extraocular movements intact.  Pupils reactive bilaterally. V/VII-Smile symmetric, equal eyebrow raise,  facial sensation intact VIII- Hearing grossly intact IX/X-Normal gag XI-bilateral shoulder shrug XII-midline tongue extension Motor: 5/5 bilaterally with normal tone and bulk Cerebellar: Normal finger-to-nose  and normal heel-to-shin test.   Romberg negative Ambulates with a coordinated gait   Skin: She is not diaphoretic.  Psychiatric: She has a normal mood and affect.  Nursing note and vitals reviewed.   ED Course  Procedures (including critical care time) Labs Review Labs Reviewed  URINALYSIS, ROUTINE W REFLEX MICROSCOPIC (NOT AT [facility]) - Abnormal; Notable for the following:    APPearance CLOUDY (*)    Leukocytes, UA TRACE (*)    All other components within normal limits  CBC WITH DIFFERENTIAL/PLATELET - Abnormal; Notable for the following:    WBC 20.1 (*)    Hemoglobin 10.0 (*)    HCT 31.4 (*)    MCV 77.1 (*)    MCH 24.6 (*)    Neutrophils Relative % 84 (*)    Neutro Abs 16.9 (*)    Lymphocytes Relative 8 (*)    Monocytes Absolute 1.5 (*)    All other components within normal limits  BASIC METABOLIC PANEL - Abnormal; Notable for the following:    Calcium 8.6 (*)    All other components within normal limits  URINE MICROSCOPIC-ADD ON - Abnormal; Notable for the following:    Squamous Epithelial / LPF FEW (*)    All other components within normal limits  RAPID STREP SCREEN (NOT AT [facility])  CULTURE, BLOOD (ROUTINE X 2)  CULTURE, BLOOD (ROUTINE X 2)  CULTURE, GROUP A STREP  MONONUCLEOSIS SCREEN  RPR  HIV ANTIBODY (ROUTINE TESTING)  I-STAT CG4 LACTIC ACID, ED  I-STAT CG4 LACTIC ACID, ED    Imaging Review No results found.   EKG Interpretation None      MDM   Final diagnoses:  Pharyngitis  Fever,  unspecified fever cause  Leukocytosis    Filed Vitals:   04/05/15 1240 04/05/15 1518 04/05/15 1558  BP: 149/92 139/88 129/82  Pulse: 104 91 89  Temp: 101.5 F (38.6 C) 98.9 F (37.2 C) 98.9 F (37.2 C)  TempSrc: Oral  Oral  Resp: 18 18 18   Height: 5\' 2"  (1.575 m)    Weight: 205 lb (92.987 kg)    SpO2: 99% 97% 100%    Medications  ibuprofen (ADVIL,MOTRIN) tablet 400 mg (400 mg Oral Given 04/05/15 1306)  sodium chloride 0.9 % bolus 1,000 mL (0 mLs Intravenous Stopped 04/05/15 1501)  dexamethasone (DECADRON) injection 10 mg (10 mg Intravenous Given 04/05/15 1435)  amoxicillin (AMOXIL) capsule 500 mg (500 mg Oral Given 04/05/15 1543)    Kristin Herrera is a pleasant 44 y.o. female presenting with pharyngitis, diffuse myalgia with fever. Patient mildly tachycardic. She is well appearing, nonseptic. Her rapid strep is negative. She has a significant leukocytosis of 20.1. She has no symptoms  of a UTI and her urinalysis is clean. Patient denies chest chest pain, cough, shortness of breath, no indication for chest x-ray. No meningeal signs, neuro exam is nonfocal. Discussed case with attending who recommends mono, RPR and HIV. Patient understands that these tests will not be resulted today, she will have to follow with her primary care doctor, she feels much better after receiving IV fluids. Her lactic acid is normal. Will start her on amoxicillin for presumed strep pharyngitis.  This is a shared visit with the attending physician who personally evaluated the patient and agrees with the care plan.   Evaluation does not show pathology that would require ongoing emergent intervention or inpatient treatment. Pt is hemodynamically stable and mentating appropriately. Discussed findings and plan with patient/guardian, who agrees with care plan. All questions answered. Return precautions discussed and outpatient follow up given.        Monico Blitz, PA-C 04/05/15 [place],  MD 04/09/15 0710

## 2015-04-05 NOTE — ED Notes (Signed)
C/o body aches, sore throat-started yesterday-last dose tylenol 650mg  at 11am

## 2015-04-06 ENCOUNTER — Telehealth: Payer: Self-pay | Admitting: Family

## 2015-04-06 LAB — HIV ANTIBODY (ROUTINE TESTING W REFLEX): HIV Screen 4th Generation wRfx: NONREACTIVE

## 2015-04-06 LAB — RPR, QUANT+TP ABS (REFLEX)
Rapid Plasma Reagin, Quant: 1:8 {titer} — ABNORMAL HIGH
T Pallidum Abs: POSITIVE — AB

## 2015-04-06 LAB — RPR: RPR: REACTIVE — AB

## 2015-04-06 NOTE — Telephone Encounter (Signed)
Attempted to reach pt and the phone number listed is not a working number. Will attempt to call later.

## 2015-04-06 NOTE — Telephone Encounter (Signed)
Reviewed ED labs.  Note is made of new anemia. I would like pt to arrange an office visit with me to evaluate for anemia in the next few weeks.

## 2015-04-07 LAB — CULTURE, GROUP A STREP: Strep A Culture: POSITIVE — AB

## 2015-04-08 ENCOUNTER — Telehealth (HOSPITAL_COMMUNITY): Payer: Self-pay

## 2015-04-08 NOTE — Telephone Encounter (Signed)
Post ED Visit - Positive Culture Follow-up  Culture report reviewed by antimicrobial stewardship pharmacist: []  Wes Dulaney, Pharm.D., BCPS []  Heide Guile, Pharm.D., BCPS []  Alycia Rossetti, Pharm.D., BCPS []  [place], Pharm.D., BCPS, AAHIVP []  Legrand Como, Pharm.D., BCPS, AAHIVP []  Isac Sarna, Pharm.D., BCPS X  Stewart,Cassie, Pharm. D.  Positive Throat culture,  Group A Strep Treated with Amoxicillin, organism sensitive to the same and no further patient follow-up is required at this time.  Dortha Kern 04/08/2015, 11:46 AM

## 2015-04-09 ENCOUNTER — Telehealth: Payer: Self-pay | Admitting: Emergency Medicine

## 2015-04-10 LAB — CULTURE, BLOOD (ROUTINE X 2)
CULTURE: NO GROWTH
Culture: NO GROWTH

## 2015-04-11 ENCOUNTER — Ambulatory Visit (INDEPENDENT_AMBULATORY_CARE_PROVIDER_SITE_OTHER): Payer: PRIVATE HEALTH INSURANCE | Admitting: Family

## 2015-04-11 ENCOUNTER — Encounter: Payer: Self-pay | Admitting: Family

## 2015-04-11 VITALS — BP 144/100 | HR 82 | Temp 98.2°F | Resp 16 | Ht 63.0 in | Wt 199.2 lb

## 2015-04-11 DIAGNOSIS — Z8619 Personal history of other infectious and parasitic diseases: Secondary | ICD-10-CM | POA: Insufficient documentation

## 2015-04-11 DIAGNOSIS — D649 Anemia, unspecified: Secondary | ICD-10-CM | POA: Diagnosis not present

## 2015-04-11 DIAGNOSIS — A539 Syphilis, unspecified: Secondary | ICD-10-CM | POA: Diagnosis not present

## 2015-04-11 DIAGNOSIS — D509 Iron deficiency anemia, unspecified: Secondary | ICD-10-CM | POA: Insufficient documentation

## 2015-04-11 LAB — CBC WITH DIFFERENTIAL/PLATELET
Basophils Absolute: 0 10*3/uL (ref 0.0–0.1)
Basophils Relative: 0.3 % (ref 0.0–3.0)
Eosinophils Absolute: 0.1 10*3/uL (ref 0.0–0.7)
Eosinophils Relative: 0.8 % (ref 0.0–5.0)
HCT: 35.2 % — ABNORMAL LOW (ref 36.0–46.0)
HEMOGLOBIN: 10.9 g/dL — AB (ref 12.0–15.0)
Lymphocytes Relative: 19.8 % (ref 12.0–46.0)
Lymphs Abs: 2.8 10*3/uL (ref 0.7–4.0)
MCHC: 31.1 g/dL (ref 30.0–36.0)
MCV: 77.1 fl — ABNORMAL LOW (ref 78.0–100.0)
MONO ABS: 0.9 10*3/uL (ref 0.1–1.0)
Monocytes Relative: 6.2 % (ref 3.0–12.0)
Neutro Abs: 10.3 10*3/uL — ABNORMAL HIGH (ref 1.4–7.7)
Neutrophils Relative %: 72.9 % (ref 43.0–77.0)
Platelets: 372 10*3/uL (ref 150.0–400.0)
RBC: 4.56 Mil/uL (ref 3.87–5.11)
RDW: 15.9 % — ABNORMAL HIGH (ref 11.5–15.5)
WBC: 14.1 10*3/uL — ABNORMAL HIGH (ref 4.0–10.5)

## 2015-04-11 LAB — IRON: Iron: 32 ug/dL — ABNORMAL LOW (ref 42–145)

## 2015-04-11 MED ORDER — IRON 325 (65 FE) MG PO TABS: 1.0000 | ORAL_TABLET | Freq: Two times a day (BID) | ORAL | Status: DC

## 2015-04-11 NOTE — Patient Instructions (Signed)
Complete lab work prior to leaving. Start iron 313m twice daily (available otc). Complete stool kit an return at your earliest convenience. Restart blood pressure medicine. Complete amoxicillin, call if sore throat does not continue to improve or if you devlop fever.

## 2015-04-11 NOTE — Progress Notes (Signed)
Pre visit review using our clinic review tool, if applicable. No additional management support is needed unless otherwise documented below in the visit note. 

## 2015-04-11 NOTE — Assessment & Plan Note (Signed)
Iron is low. Likely due to menstrual loss.  However, will also check IFOB. Add iron supplement.

## 2015-04-11 NOTE — Progress Notes (Signed)
Subjective:    Patient ID: Kristin Herrera, female    DOB: 1971/01/25, 44 y.o.   MRN: [id number]  HPI  Pt presents today for ED follow up. She was seen on 8/9 for severe sore throat. ED records are reviewed. She underwent rapid strep which was negative.  WBC was 20K. She was noted to have a new microcytic anemia.  RPR was positive (has previous hx of syphillis which was treated as a teenager).  HIV screen and Mono screen were both negative.  Blood culture was negative.  on day 6 of amoxicillin for severe pharyngitis.  Had neg rapid strep.  She reports that her throat is not as sore as it was. Reports she is able to swallow and eat without difficulty. She denies recent fever.  Does not have one side of her throat that hurts more than the other.   Patient has mirena.  Reports that she has been "eating a lot of ice."    Ran out of BP meds- will pick up rx today.   Review of Systems Past Medical History  Diagnosis Date  . Hypertension   . Frequent episodic tension-type headache   . Diverticulitis   . Gestational diabetes     Social History   Social History  . Marital Status: Married    Spouse Name: N/A  . Number of Children: 2  . Years of Education: N/A   Occupational History  . HOUSEHOLD COORDINATOR    Social History Main Topics  . Smoking status: Former Smoker -- 10 years    Types: Cigarettes    Quit date: 03/10/2010  . Smokeless tobacco: Never Used  . Alcohol Use: No  . Drug Use: No  . Sexual Activity: Yes    Birth Control/ Protection: Surgical   Other Topics Concern  . Not on file   Social History Narrative   Caffeine Use:  1 daily   Regular exercise:  No   Married   2 children Son age 21 and daughter age 77   Works at [employer] as a Nurse, learning disability.   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     Complete 12 th grade                Past Surgical History  Procedure Laterality Date  . Cesarean section  1991, 1999  . Tubal ligation  1999    Family History    Problem Relation Age of Onset  . Hypertension Mother   . Heart disease Father   . Heart disease Paternal Uncle   . Hypertension Maternal Grandmother   . Diabetes Maternal Grandmother   . Stroke Paternal Grandmother   . Hypertension Paternal Grandmother   . Diabetes Paternal Grandmother   . Heart disease Paternal Grandfather     Allergies  Allergen Reactions  . Simvastatin     cramping    Current Outpatient Prescriptions on File Prior to Visit  Medication Sig Dispense Refill  . amoxicillin (AMOXIL) 500 MG capsule Take 1 capsule (500 mg total) by mouth 3 (three) times daily. 30 capsule 0  . levonorgestrel (MIRENA) 20 MCG/24HR IUD 1 each by Intrauterine route once.    . valsartan-hydrochlorothiazide (DIOVAN-HCT) 80-12.5 MG per tablet TAKE 1/2 TABLET BY MOUTH DAILY 15 tablet 2   No current facility-administered medications on file prior to visit.    BP 144/100 mmHg  Pulse 82  Temp(Src) 98.2 F (36.8 C) (Oral)  Resp 16  Ht 5\' 3"  (1.6 m)  Wt 199 lb 3.2  oz (90.357 kg)  BMI 35.30 kg/m2  SpO2 99%  LMP 03/26/2015       Objective:   Physical Exam  Constitutional: She is oriented to person, place, and time. She appears well-developed and well-nourished.  HENT:  Head: Normocephalic and atraumatic.  Right Ear: Tympanic membrane and ear canal normal.  Left Ear: Tympanic membrane and ear canal normal.  Mouth/Throat: No posterior oropharyngeal edema or posterior oropharyngeal erythema.  Cardiovascular: Normal rate, regular rhythm and normal heart sounds.   No murmur heard. Pulmonary/Chest: Effort normal and breath sounds normal. No respiratory distress. She has no wheezes.  Musculoskeletal: She exhibits no edema.  Lymphadenopathy:    She has no cervical adenopathy.  Neurological: She is alert and oriented to person, place, and time.  Psychiatric: She has a normal mood and affect. Her behavior is normal. Judgment and thought content normal.          Assessment & Plan:   Pharyngitis-  WBC is trending downward (was 20K now 14K), clinically improving. Advised pt to complete amoxicillin and call if symptoms worsen, if fever, or if sore throat does not continue to improve.

## 2015-04-11 NOTE — Telephone Encounter (Signed)
Pt seen and discussed today at [facility].

## 2015-04-12 ENCOUNTER — Encounter: Payer: Self-pay | Admitting: Family

## 2015-04-14 ENCOUNTER — Telehealth: Payer: Self-pay | Admitting: Family

## 2015-04-14 DIAGNOSIS — A539 Syphilis, unspecified: Secondary | ICD-10-CM

## 2015-04-14 NOTE — Telephone Encounter (Signed)
-----   Message from Mosie Lukes, MD sent at 04/14/2015  5:11 PM EDT ----- I would refer to ID and report to health department.  ----- Message -----    From: Debbrah Alar, NP    Sent: 04/14/2015   3:44 PM      To: Mosie Lukes, MD  Please see RPR. This pt was treated as a teenager. Old records unavailable at this time. What would you do with this result? Would you send to ID?  Thanks!

## 2015-04-14 NOTE — Telephone Encounter (Signed)
Please let pt know that I would like her to meet with ID re: + syphilis testing for further evaluation.

## 2015-04-14 NOTE — Assessment & Plan Note (Signed)
+   RPR, + titer. Refer to ID for further evaluation.

## 2015-04-15 NOTE — Telephone Encounter (Signed)
Notified pt and she is agreeable to proceed with ID referral. Agency Dept has already contacted her and reviewed her case but she would still like to proceed with referral. Referral signed.

## 2015-04-19 ENCOUNTER — Ambulatory Visit: Payer: PRIVATE HEALTH INSURANCE | Admitting: Family Medicine

## 2015-04-20 ENCOUNTER — Telehealth (HOSPITAL_BASED_OUTPATIENT_CLINIC_OR_DEPARTMENT_OTHER): Payer: Self-pay | Admitting: Emergency Medicine

## 2015-04-20 NOTE — Telephone Encounter (Signed)
Letter returned, no known forwarding address, lost to followup

## 2015-05-05 ENCOUNTER — Telehealth (HOSPITAL_COMMUNITY): Payer: Self-pay

## 2015-05-05 NOTE — Telephone Encounter (Signed)
Unable to contact pt by mail or telephone. Unable to communicate lab results or treatment changes. 

## 2015-05-24 ENCOUNTER — Ambulatory Visit: Payer: PRIVATE HEALTH INSURANCE | Admitting: Infectious Disease

## 2015-05-26 ENCOUNTER — Other Ambulatory Visit: Payer: Self-pay | Admitting: Family

## 2015-06-03 ENCOUNTER — Ambulatory Visit
Admission: RE | Admit: 2015-06-03 | Discharge: 2015-06-03 | Disposition: A | Discharge status: Home / Self Care | Payer: PRIVATE HEALTH INSURANCE | Source: Ambulatory Visit | Attending: Obstetrics and Gynecology | Admitting: Obstetrics and Gynecology

## 2015-06-03 ENCOUNTER — Other Ambulatory Visit: Payer: Self-pay | Admitting: Obstetrics and Gynecology

## 2015-06-03 DIAGNOSIS — T8389XA Other specified complication of genitourinary prosthetic devices, implants and grafts, initial encounter: Secondary | ICD-10-CM

## 2015-08-02 ENCOUNTER — Encounter: Payer: Self-pay | Admitting: Family

## 2015-08-02 ENCOUNTER — Ambulatory Visit (INDEPENDENT_AMBULATORY_CARE_PROVIDER_SITE_OTHER): Payer: PRIVATE HEALTH INSURANCE | Admitting: Family

## 2015-08-02 VITALS — BP 150/98 | HR 98 | Temp 98.4°F | Resp 18 | Ht 63.0 in | Wt 203.0 lb

## 2015-08-02 DIAGNOSIS — I10 Essential (primary) hypertension: Secondary | ICD-10-CM | POA: Diagnosis not present

## 2015-08-02 DIAGNOSIS — Z23 Encounter for immunization: Secondary | ICD-10-CM | POA: Diagnosis not present

## 2015-08-02 DIAGNOSIS — Z0000 Encounter for general adult medical examination without abnormal findings: Secondary | ICD-10-CM | POA: Insufficient documentation

## 2015-08-02 NOTE — Assessment & Plan Note (Signed)
Discussed healthy diet, exercise, weight loss.  She will schedule mammogram in February.

## 2015-08-02 NOTE — Patient Instructions (Signed)
Please work on [employer], exercise, weight loss. Schedule mammogram for February.   Preventive Care for Adults, Female A healthy lifestyle and preventive care can promote health and wellness. Preventive health guidelines for women include the following key practices.  A routine yearly physical is a good way to check with your health care provider about your health and preventive screening. It is a chance to share any concerns and updates on your health and to receive a thorough exam.  Visit your dentist for a routine exam and preventive care every 6 months. Brush your teeth twice a day and floss once a day. Good oral hygiene prevents tooth decay and gum disease.  The frequency of eye exams is based on your age, health, family medical history, use of contact lenses, and other factors. Follow your health care provider's recommendations for frequency of eye exams.  Eat a healthy diet. Foods like vegetables, fruits, whole grains, low-fat dairy products, and lean protein foods contain the nutrients you need without too many calories. Decrease your intake of foods high in solid fats, added sugars, and salt. Eat the right amount of calories for you.Get information about a proper diet from your health care provider, if necessary.  Regular physical exercise is one of the most important things you can do for your health. Most adults should get at least 150 minutes of moderate-intensity exercise (any activity that increases your heart rate and causes you to sweat) each week. In addition, most adults need muscle-strengthening exercises on 2 or more days a week.  Maintain a healthy weight. The body mass index (BMI) is a screening tool to identify possible weight problems. It provides an estimate of body fat based on height and weight. Your health care provider can find your BMI and can help you achieve or maintain a healthy weight.For adults 20 years and older:  A BMI below 18.5 is considered  underweight.  A BMI of 18.5 to 24.9 is normal.  A BMI of 25 to 29.9 is considered overweight.  A BMI of 30 and above is considered obese.  Maintain normal blood lipids and cholesterol levels by exercising and minimizing your intake of saturated fat. Eat a balanced diet with plenty of fruit and vegetables. Blood tests for lipids and cholesterol should begin at age 63 and be repeated every 5 years. If your lipid or cholesterol levels are high, you are over 50, or you are at high risk for heart disease, you may need your cholesterol levels checked more frequently.Ongoing high lipid and cholesterol levels should be treated with medicines if diet and exercise are not working.  If you smoke, find out from your health care provider how to quit. If you do not use tobacco, do not start.  Lung cancer screening is recommended for adults aged 24-80 years who are at high risk for developing lung cancer because of a history of smoking. A yearly low-dose CT scan of the lungs is recommended for people who have at least a 30-pack-year history of smoking and are a current smoker or have quit within the past 15 years. A pack year of smoking is smoking an average of 1 pack of cigarettes a day for 1 year (for example: 1 pack a day for 30 years or 2 packs a day for 15 years). Yearly screening should continue until the smoker has stopped smoking for at least 15 years. Yearly screening should be stopped for people who develop a health problem that would prevent them from having lung  cancer treatment.  If you are pregnant, do not drink alcohol. If you are breastfeeding, be very cautious about drinking alcohol. If you are not pregnant and choose to drink alcohol, do not have more than 1 drink per day. One drink is considered to be 12 ounces (355 mL) of beer, 5 ounces (148 mL) of wine, or 1.5 ounces (44 mL) of liquor.  Avoid use of street drugs. Do not share needles with anyone. Ask for help if you need support or  instructions about stopping the use of drugs.  High blood pressure causes heart disease and increases the risk of stroke. Your blood pressure should be checked at least every 1 to 2 years. Ongoing high blood pressure should be treated with medicines if weight loss and exercise do not work.  If you are 53-97 years old, ask your health care provider if you should take aspirin to prevent strokes.  Diabetes screening is done by taking a blood sample to check your blood glucose level after you have not eaten for a certain period of time (fasting). If you are not overweight and you do not have risk factors for diabetes, you should be screened once every 3 years starting at age 59. If you are overweight or obese and you are 4-18 years of age, you should be screened for diabetes every year as part of your cardiovascular risk assessment.  Breast cancer screening is essential preventive care for women. You should practice "breast self-awareness." This means understanding the normal appearance and feel of your breasts and may include breast self-examination. Any changes detected, no matter how small, should be reported to a health care provider. Women in their 1s and 30s should have a clinical breast exam (CBE) by a health care provider as part of a regular health exam every 1 to 3 years. After age 62, women should have a CBE every year. Starting at age 74, women should consider having a mammogram (breast X-ray test) every year. Women who have a family history of breast cancer should talk to their health care provider about genetic screening. Women at a high risk of breast cancer should talk to their health care providers about having an MRI and a mammogram every year.  Breast cancer gene (BRCA)-related cancer risk assessment is recommended for women who have family members with BRCA-related cancers. BRCA-related cancers include breast, ovarian, tubal, and peritoneal cancers. Having family members with these  cancers may be associated with an increased risk for harmful changes (mutations) in the breast cancer genes BRCA1 and BRCA2. Results of the assessment will determine the need for genetic counseling and BRCA1 and BRCA2 testing.  Your health care provider may recommend that you be screened regularly for cancer of the pelvic organs (ovaries, uterus, and vagina). This screening involves a pelvic examination, including checking for microscopic changes to the surface of your cervix (Pap test). You may be encouraged to have this screening done every 3 years, beginning at age 40.  For women ages 63-65, health care providers may recommend pelvic exams and Pap testing every 3 years, or they may recommend the Pap and pelvic exam, combined with testing for human papilloma virus (HPV), every 5 years. Some types of HPV increase your risk of cervical cancer. Testing for HPV may also be done on women of any age with unclear Pap test results.  Other health care providers may not recommend any screening for nonpregnant women who are considered low risk for pelvic cancer and who do not have  symptoms. Ask your health care provider if a screening pelvic exam is right for you.  If you have had past treatment for cervical cancer or a condition that could lead to cancer, you need Pap tests and screening for cancer for at least 20 years after your treatment. If Pap tests have been discontinued, your risk factors (such as having a new sexual partner) need to be reassessed to determine if screening should resume. Some women have medical problems that increase the chance of getting cervical cancer. In these cases, your health care provider may recommend more frequent screening and Pap tests.  Colorectal cancer can be detected and often prevented. Most routine colorectal cancer screening begins at the age of 50 years and continues through age 34 years. However, your health care provider may recommend screening at an earlier age if you  have risk factors for colon cancer. On a yearly basis, your health care provider may provide home test kits to check for hidden blood in the stool. Use of a small camera at the end of a tube, to directly examine the colon (sigmoidoscopy or colonoscopy), can detect the earliest forms of colorectal cancer. Talk to your health care provider about this at age 13, when routine screening begins. Direct exam of the colon should be repeated every 5-10 years through age 66 years, unless early forms of precancerous polyps or small growths are found.  People who are at an increased risk for hepatitis B should be screened for this virus. You are considered at high risk for hepatitis B if:  You were born in a country where hepatitis B occurs often. Talk with your health care provider about which countries are considered high risk.  Your parents were born in a high-risk country and you have not received a shot to protect against hepatitis B (hepatitis B vaccine).  You have HIV or AIDS.  You use needles to inject street drugs.  You live with, or have sex with, someone who has hepatitis B.  You get hemodialysis treatment.  You take certain medicines for conditions like cancer, organ transplantation, and autoimmune conditions.  Hepatitis C blood testing is recommended for all people born from [birth year] through 1965 and any individual with known risks for hepatitis C.  Practice safe sex. Use condoms and avoid high-risk sexual practices to reduce the spread of sexually transmitted infections (STIs). STIs include gonorrhea, chlamydia, syphilis, trichomonas, herpes, HPV, and human immunodeficiency virus (HIV). Herpes, HIV, and HPV are viral illnesses that have no cure. They can result in disability, cancer, and death.  You should be screened for sexually transmitted illnesses (STIs) including gonorrhea and chlamydia if:  You are sexually active and are younger than 24 years.  You are older than 24 years and your  health care provider tells you that you are at risk for this type of infection.  Your sexual activity has changed since you were last screened and you are at an increased risk for chlamydia or gonorrhea. Ask your health care provider if you are at risk.  If you are at risk of being infected with HIV, it is recommended that you take a prescription medicine daily to prevent HIV infection. This is called preexposure prophylaxis (PrEP). You are considered at risk if:  You are sexually active and do not regularly use condoms or know the HIV status of your partner(s).  You take drugs by injection.  You are sexually active with a partner who has HIV.  Talk with your health care  provider about whether you are at high risk of being infected with HIV. If you choose to begin PrEP, you should first be tested for HIV. You should then be tested every 3 months for as long as you are taking PrEP.  Osteoporosis is a disease in which the bones lose minerals and strength with aging. This can result in serious bone fractures or breaks. The risk of osteoporosis can be identified using a bone density scan. Women ages 41 years and over and women at risk for fractures or osteoporosis should discuss screening with their health care providers. Ask your health care provider whether you should take a calcium supplement or vitamin D to reduce the rate of osteoporosis.  Menopause can be associated with physical symptoms and risks. Hormone replacement therapy is available to decrease symptoms and risks. You should talk to your health care provider about whether hormone replacement therapy is right for you.  Use sunscreen. Apply sunscreen liberally and repeatedly throughout the day. You should seek shade when your shadow is shorter than you. Protect yourself by wearing long sleeves, pants, a wide-brimmed hat, and sunglasses year round, whenever you are outdoors.  Once a month, do a whole body skin exam, using a mirror to look  at the skin on your back. Tell your health care provider of new moles, moles that have irregular borders, moles that are larger than a pencil eraser, or moles that have changed in shape or color.  Stay current with required vaccines (immunizations).  Influenza vaccine. All adults should be immunized every year.  Tetanus, diphtheria, and acellular pertussis (Td, Tdap) vaccine. Pregnant women should receive 1 dose of Tdap vaccine during each pregnancy. The dose should be obtained regardless of the length of time since the last dose. Immunization is preferred during the 27th-36th week of gestation. An adult who has not previously received Tdap or who does not know her vaccine status should receive 1 dose of Tdap. This initial dose should be followed by tetanus and diphtheria toxoids (Td) booster doses every 10 years. Adults with an unknown or incomplete history of completing a 3-dose immunization series with Td-containing vaccines should begin or complete a primary immunization series including a Tdap dose. Adults should receive a Td booster every 10 years.  Varicella vaccine. An adult without evidence of immunity to varicella should receive 2 doses or a second dose if she has previously received 1 dose. Pregnant females who do not have evidence of immunity should receive the first dose after pregnancy. This first dose should be obtained before leaving the health care facility. The second dose should be obtained 4-8 weeks after the first dose.  Human papillomavirus (HPV) vaccine. Females aged 13-26 years who have not received the vaccine previously should obtain the 3-dose series. The vaccine is not recommended for use in pregnant females. However, pregnancy testing is not needed before receiving a dose. If a female is found to be pregnant after receiving a dose, no treatment is needed. In that case, the remaining doses should be delayed until after the pregnancy. Immunization is recommended for any person  with an immunocompromised condition through the age of 44 years if she did not get any or all doses earlier. During the 3-dose series, the second dose should be obtained 4-8 weeks after the first dose. The third dose should be obtained 24 weeks after the first dose and 16 weeks after the second dose.  Zoster vaccine. One dose is recommended for adults aged 4 years or older  unless certain conditions are present.  Measles, mumps, and rubella (MMR) vaccine. Adults born before [birth year] generally are considered immune to measles and mumps. Adults born in [birth year] or later should have 1 or more doses of MMR vaccine unless there is a contraindication to the vaccine or there is laboratory evidence of immunity to each of the three diseases. A routine second dose of MMR vaccine should be obtained at least 28 days after the first dose for students attending postsecondary schools, health care workers, or international travelers. People who received inactivated measles vaccine or an unknown type of measles vaccine during 1963-1967 should receive 2 doses of MMR vaccine. People who received inactivated mumps vaccine or an unknown type of mumps vaccine before 1979 and are at high risk for mumps infection should consider immunization with 2 doses of MMR vaccine. For females of childbearing age, rubella immunity should be determined. If there is no evidence of immunity, females who are not pregnant should be vaccinated. If there is no evidence of immunity, females who are pregnant should delay immunization until after pregnancy. Unvaccinated health care workers born before [birth year] who lack laboratory evidence of measles, mumps, or rubella immunity or laboratory confirmation of disease should consider measles and mumps immunization with 2 doses of MMR vaccine or rubella immunization with 1 dose of MMR vaccine.  Pneumococcal 13-valent conjugate (PCV13) vaccine. When indicated, a person who is uncertain of his immunization history and has  no record of immunization should receive the PCV13 vaccine. All adults 105 years of age and older should receive this vaccine. An adult aged 14 years or older who has certain medical conditions and has not been previously immunized should receive 1 dose of PCV13 vaccine. This PCV13 should be followed with a dose of pneumococcal polysaccharide (PPSV23) vaccine. Adults who are at high risk for pneumococcal disease should obtain the PPSV23 vaccine at least 8 weeks after the dose of PCV13 vaccine. Adults older than 43 years of age who have normal immune system function should obtain the PPSV23 vaccine dose at least 1 year after the dose of PCV13 vaccine.  Pneumococcal polysaccharide (PPSV23) vaccine. When PCV13 is also indicated, PCV13 should be obtained first. All adults aged 60 years and older should be immunized. An adult younger than age 35 years who has certain medical conditions should be immunized. Any person who resides in a nursing home or long-term care facility should be immunized. An adult smoker should be immunized. People with an immunocompromised condition and certain other conditions should receive both PCV13 and PPSV23 vaccines. People with human immunodeficiency virus (HIV) infection should be immunized as soon as possible after diagnosis. Immunization during chemotherapy or radiation therapy should be avoided. Routine use of PPSV23 vaccine is not recommended for American Indians, [place] Natives, or people younger than 65 years unless there are medical conditions that require PPSV23 vaccine. When indicated, people who have unknown immunization and have no record of immunization should receive PPSV23 vaccine. One-time revaccination 5 years after the first dose of PPSV23 is recommended for people aged 19-64 years who have chronic kidney failure, nephrotic syndrome, asplenia, or immunocompromised conditions. People who received 1-2 doses of PPSV23 before age 67 years should receive another dose of PPSV23  vaccine at age 64 years or later if at least 5 years have passed since the previous dose. Doses of PPSV23 are not needed for people immunized with PPSV23 at or after age 33 years.  Meningococcal vaccine. Adults with asplenia or persistent complement component deficiencies should  receive 2 doses of quadrivalent meningococcal conjugate (MenACWY-D) vaccine. The doses should be obtained at least 2 months apart. Microbiologists working with certain meningococcal bacteria, [employer] recruits, people at risk during an outbreak, and people who travel to or live in countries with a high rate of meningitis should be immunized. A first-year college student up through age 75 years who is living in a residence hall should receive a dose if she did not receive a dose on or after her 16th birthday. Adults who have certain high-risk conditions should receive one or more doses of vaccine.  Hepatitis A vaccine. Adults who wish to be protected from this disease, have certain high-risk conditions, work with hepatitis A-infected animals, work in hepatitis A research labs, or travel to or work in countries with a high rate of hepatitis A should be immunized. Adults who were previously unvaccinated and who anticipate close contact with an international adoptee during the first 60 days after arrival in the [place] from a country with a high rate of hepatitis A should be immunized.  Hepatitis B vaccine. Adults who wish to be protected from this disease, have certain high-risk conditions, may be exposed to blood or other infectious body fluids, are household contacts or sex partners of hepatitis B positive people, are clients or workers in certain care facilities, or travel to or work in countries with a high rate of hepatitis B should be immunized.  Haemophilus influenzae type b (Hib) vaccine. A previously unvaccinated person with asplenia or sickle cell disease or having a scheduled splenectomy should receive 1 dose of Hib  vaccine. Regardless of previous immunization, a recipient of a hematopoietic stem cell transplant should receive a 3-dose series 6-12 months after her successful transplant. Hib vaccine is not recommended for adults with HIV infection. Preventive Services / Frequency Ages 60 to 54 years  Blood pressure check.** / Every 3-5 years.  Lipid and cholesterol check.** / Every 5 years beginning at age 76.  Clinical breast exam.** / Every 3 years for women in their 23s and 64s.  BRCA-related cancer risk assessment.** / For women who have family members with a BRCA-related cancer (breast, ovarian, tubal, or peritoneal cancers).  Pap test.** / Every 2 years from ages 64 through 78. Every 3 years starting at age 81 through age 35 or 85 with a history of 3 consecutive normal Pap tests.  HPV screening.** / Every 3 years from ages 30 through ages 69 to 32 with a history of 3 consecutive normal Pap tests.  Hepatitis C blood test.** / For any individual with known risks for hepatitis C.  Skin self-exam. / Monthly.  Influenza vaccine. / Every year.  Tetanus, diphtheria, and acellular pertussis (Tdap, Td) vaccine.** / Consult your health care provider. Pregnant women should receive 1 dose of Tdap vaccine during each pregnancy. 1 dose of Td every 10 years.  Varicella vaccine.** / Consult your health care provider. Pregnant females who do not have evidence of immunity should receive the first dose after pregnancy.  HPV vaccine. / 3 doses over 6 months, if 75 and younger. The vaccine is not recommended for use in pregnant females. However, pregnancy testing is not needed before receiving a dose.  Measles, mumps, rubella (MMR) vaccine.** / You need at least 1 dose of MMR if you were born in 1957 or later. You may also need a 2nd dose. For females of childbearing age, rubella immunity should be determined. If there is no evidence of immunity, females who are not  pregnant should be vaccinated. If there is no  evidence of immunity, females who are pregnant should delay immunization until after pregnancy.  Pneumococcal 13-valent conjugate (PCV13) vaccine.** / Consult your health care provider.  Pneumococcal polysaccharide (PPSV23) vaccine.** / 1 to 2 doses if you smoke cigarettes or if you have certain conditions.  Meningococcal vaccine.** / 1 dose if you are age 47 to 25 years and a Market researcher living in a residence hall, or have one of several medical conditions, you need to get vaccinated against meningococcal disease. You may also need additional booster doses.  Hepatitis A vaccine.** / Consult your health care provider.  Hepatitis B vaccine.** / Consult your health care provider.  Haemophilus influenzae type b (Hib) vaccine.** / Consult your health care provider. Ages 48 to 92 years  Blood pressure check.** / Every year.  Lipid and cholesterol check.** / Every 5 years beginning at age 67 years.  Lung cancer screening. / Every year if you are aged 68-80 years and have a 30-pack-year history of smoking and currently smoke or have quit within the past 15 years. Yearly screening is stopped once you have quit smoking for at least 15 years or develop a health problem that would prevent you from having lung cancer treatment.  Clinical breast exam.** / Every year after age 19 years.  BRCA-related cancer risk assessment.** / For women who have family members with a BRCA-related cancer (breast, ovarian, tubal, or peritoneal cancers).  Mammogram.** / Every year beginning at age 77 years and continuing for as long as you are in good health. Consult with your health care provider.  Pap test.** / Every 3 years starting at age 86 years through age 64 or 52 years with a history of 3 consecutive normal Pap tests.  HPV screening.** / Every 3 years from ages 42 years through ages 60 to 56 years with a history of 3 consecutive normal Pap tests.  Fecal occult blood test (FOBT) of stool. /  Every year beginning at age 67 years and continuing until age 31 years. You may not need to do this test if you get a colonoscopy every 10 years.  Flexible sigmoidoscopy or colonoscopy.** / Every 5 years for a flexible sigmoidoscopy or every 10 years for a colonoscopy beginning at age 29 years and continuing until age 26 years.  Hepatitis C blood test.** / For all people born from [birth year] through 1964 and any individual with known risks for hepatitis C.  Skin self-exam. / Monthly.  Influenza vaccine. / Every year.  Tetanus, diphtheria, and acellular pertussis (Tdap/Td) vaccine.** / Consult your health care provider. Pregnant women should receive 1 dose of Tdap vaccine during each pregnancy. 1 dose of Td every 10 years.  Varicella vaccine.** / Consult your health care provider. Pregnant females who do not have evidence of immunity should receive the first dose after pregnancy.  Zoster vaccine.** / 1 dose for adults aged 81 years or older.  Measles, mumps, rubella (MMR) vaccine.** / You need at least 1 dose of MMR if you were born in 1957 or later. You may also need a second dose. For females of childbearing age, rubella immunity should be determined. If there is no evidence of immunity, females who are not pregnant should be vaccinated. If there is no evidence of immunity, females who are pregnant should delay immunization until after pregnancy.  Pneumococcal 13-valent conjugate (PCV13) vaccine.** / Consult your health care provider.  Pneumococcal polysaccharide (PPSV23) vaccine.** / 1 to 2 doses  if you smoke cigarettes or if you have certain conditions.  Meningococcal vaccine.** / Consult your health care provider.  Hepatitis A vaccine.** / Consult your health care provider.  Hepatitis B vaccine.** / Consult your health care provider.  Haemophilus influenzae type b (Hib) vaccine.** / Consult your health care provider. Ages 38 years and over  Blood pressure check.** / Every year.  Lipid  and cholesterol check.** / Every 5 years beginning at age 45 years.  Lung cancer screening. / Every year if you are aged 57-80 years and have a 30-pack-year history of smoking and currently smoke or have quit within the past 15 years. Yearly screening is stopped once you have quit smoking for at least 15 years or develop a health problem that would prevent you from having lung cancer treatment.  Clinical breast exam.** / Every year after age 7 years.  BRCA-related cancer risk assessment.** / For women who have family members with a BRCA-related cancer (breast, ovarian, tubal, or peritoneal cancers).  Mammogram.** / Every year beginning at age 45 years and continuing for as long as you are in good health. Consult with your health care provider.  Pap test.** / Every 3 years starting at age 40 years through age 49 or 28 years with 3 consecutive normal Pap tests. Testing can be stopped between 65 and 70 years with 3 consecutive normal Pap tests and no abnormal Pap or HPV tests in the past 10 years.  HPV screening.** / Every 3 years from ages 69 years through ages 7 or 82 years with a history of 3 consecutive normal Pap tests. Testing can be stopped between 65 and 70 years with 3 consecutive normal Pap tests and no abnormal Pap or HPV tests in the past 10 years.  Fecal occult blood test (FOBT) of stool. / Every year beginning at age 79 years and continuing until age 5 years. You may not need to do this test if you get a colonoscopy every 10 years.  Flexible sigmoidoscopy or colonoscopy.** / Every 5 years for a flexible sigmoidoscopy or every 10 years for a colonoscopy beginning at age 24 years and continuing until age 89 years.  Hepatitis C blood test.** / For all people born from [birth year] through 1964 and any individual with known risks for hepatitis C.  Osteoporosis screening.** / A one-time screening for women ages 79 years and over and women at risk for fractures or osteoporosis.  Skin  self-exam. / Monthly.  Influenza vaccine. / Every year.  Tetanus, diphtheria, and acellular pertussis (Tdap/Td) vaccine.** / 1 dose of Td every 10 years.  Varicella vaccine.** / Consult your health care provider.  Zoster vaccine.** / 1 dose for adults aged 3 years or older.  Pneumococcal 13-valent conjugate (PCV13) vaccine.** / Consult your health care provider.  Pneumococcal polysaccharide (PPSV23) vaccine.** / 1 dose for all adults aged 70 years and older.  Meningococcal vaccine.** / Consult your health care provider.  Hepatitis A vaccine.** / Consult your health care provider.  Hepatitis B vaccine.** / Consult your health care provider.  Haemophilus influenzae type b (Hib) vaccine.** / Consult your health care provider. ** Family history and personal history of risk and conditions may change your health care provider's recommendations.   This information is not intended to replace advice given to you by your health care provider. Make sure you discuss any questions you have with your health care provider.   Document Released: 10/09/2001 Document Revised: 09/03/2014 Document Reviewed: 01/08/2011 Elsevier Interactive Patient Education 2016  [employer].

## 2015-08-02 NOTE — Progress Notes (Signed)
Subjective:    Patient ID: Kristin Herrera, female    DOB: [birth year], 44 y.o.   MRN: [id number]  HPI  Kristin Herrera is a 44 yr old female who presents today for cpx.  Immunizations:  Flu shot today, tetanus up to date.  Diet: healthy Wt Readings from Last 3 Encounters:  08/02/15 203 lb (92.08 kg)  04/11/15 199 lb 3.2 oz (90.357 kg)  04/05/15 205 lb (92.987 kg)  Exercise:  Not exercising Pap Smear: 10/16- GYN (normal)- scheduled for hysterectomy- due to heavy menstrual bleeding and anemia.   Mammogram: due in 2/17 Eye exam 10/16 Dental:  10/16  Did not take bp med this am,   BP Readings from Last 3 Encounters:  08/02/15 150/98  04/11/15 144/100  04/05/15 129/82    Review of Systems  Constitutional: Negative for unexpected weight change.  HENT: Negative for hearing loss and rhinorrhea.   Eyes: Negative for visual disturbance.  Respiratory: Negative for cough.   Gastrointestinal: Negative for nausea, vomiting, diarrhea and blood in stool.  Genitourinary: Negative for dysuria and frequency.  Musculoskeletal: Negative for myalgias and arthralgias.  Skin: Negative for rash.  Neurological: Negative for headaches.  Hematological: Negative for adenopathy.  Psychiatric/Behavioral:       Denies depression/anxiety   Past Medical History  Diagnosis Date  . Hypertension   . Frequent episodic tension-type headache   . Diverticulitis   . Gestational diabetes     Social History   Social History  . Marital Status: Married    Spouse Name: N/A  . Number of Children: 2  . Years of Education: N/A   Occupational History  . HOUSEHOLD COORDINATOR    Social History Main Topics  . Smoking status: Former Smoker -- 10 years    Types: Cigarettes    Quit date: 03/10/2010  . Smokeless tobacco: Never Used  . Alcohol Use: No  . Drug Use: No  . Sexual Activity: Yes    Birth Control/ Protection: Surgical   Other Topics Concern  . Not on file   Social History Narrative   Caffeine  Use:  1 daily   Regular exercise:  No   Married   2 children Son age 72 and daughter age 43   Works at [employer] as a Nurse, learning disability.   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     Complete 12 th grade                Past Surgical History  Procedure Laterality Date  . Cesarean section  1991, 1999  . Tubal ligation  1999    Family History  Problem Relation Age of Onset  . Hypertension Mother   . Heart disease Father   . Heart disease Paternal Uncle   . Hypertension Maternal Grandmother   . Diabetes Maternal Grandmother   . Stroke Paternal Grandmother   . Hypertension Paternal Grandmother   . Diabetes Paternal Grandmother   . Heart disease Paternal Grandfather     Allergies  Allergen Reactions  . Simvastatin     cramping    Current Outpatient Prescriptions on File Prior to Visit  Medication Sig Dispense Refill  . Ferrous Sulfate (IRON) 325 (65 FE) MG TABS Take 1 tablet by mouth 2 (two) times daily. 30 each 0  . valsartan-hydrochlorothiazide (DIOVAN-HCT) 80-12.5 MG tablet TAKE 1/2 TABLET BY MOUTH DAILY 15 tablet 5   No current facility-administered medications on file prior to visit.    BP 150/98 mmHg  Pulse 98  Temp(Src)  98.4 F (36.9 C) (Oral)  Resp 18  Ht 5\' 3"  (1.6 m)  Wt 203 lb (92.08 kg)  BMI 35.97 kg/m2  SpO2 100%  LMP 07/17/2015       Objective:   Physical Exam  Physical Exam  Constitutional: She is oriented to person, place, and time. She appears well-developed and well-nourished. No distress.  HENT:  Head: Normocephalic and atraumatic.  Right Ear: Tympanic membrane and ear canal normal.  Left Ear: Tympanic membrane and ear canal normal.  Mouth/Throat: Oropharynx is clear and moist.  Eyes: Pupils are equal, round, and reactive to light. No scleral icterus.  Neck: Normal range of motion. No thyromegaly present.  Cardiovascular: Normal rate and regular rhythm.   No murmur heard. Pulmonary/Chest: Effort normal and breath sounds normal. No  respiratory distress. He has no wheezes. She has no rales. She exhibits no tenderness.  Abdominal: Soft. Bowel sounds are normal. He exhibits no distension and no mass. There is no tenderness. There is no rebound and no guarding.  Musculoskeletal: She exhibits no edema.  Lymphadenopathy:    She has no cervical adenopathy.  Neurological: She is alert and oriented to person, place, and time. She has normal reflexes. She exhibits normal muscle tone. Coordination normal.  Skin: Skin is warm and dry.  Psychiatric: She has a normal mood and affect. Her behavior is normal. Judgment and thought content normal.  Breasts: Examined lying Right: Without masses, retractions, discharge or axillary adenopathy.  Left: Without masses, retractions, discharge or axillary adenopathy.         Assessment & Plan:         Assessment & Plan:  EKG tracing is personally reviewed.  EKG notes NSR.  No acute changes.

## 2015-08-02 NOTE — Assessment & Plan Note (Signed)
Reinforced importance of med compliance.  Pt advised to take bp med daily. Follow up in 2 weeks for nurse visit BP recheck.

## 2015-08-02 NOTE — Progress Notes (Signed)
Pre visit review using our clinic review tool, if applicable. No additional management support is needed unless otherwise documented below in the visit note. 

## 2015-08-03 LAB — CBC WITH DIFFERENTIAL/PLATELET
BASOS ABS: 0 10*3/uL (ref 0.0–0.1)
Basophils Relative: 0.1 % (ref 0.0–3.0)
Eosinophils Absolute: 0.2 10*3/uL (ref 0.0–0.7)
Eosinophils Relative: 1.6 % (ref 0.0–5.0)
HCT: 35.7 % — ABNORMAL LOW (ref 36.0–46.0)
HEMOGLOBIN: 11.2 g/dL — AB (ref 12.0–15.0)
Lymphocytes Relative: 21.4 % (ref 12.0–46.0)
Lymphs Abs: 2.4 10*3/uL (ref 0.7–4.0)
MCHC: 31.5 g/dL (ref 30.0–36.0)
MCV: 78.3 fl (ref 78.0–100.0)
MONO ABS: 0.4 10*3/uL (ref 0.1–1.0)
MONOS PCT: 3.1 % (ref 3.0–12.0)
Neutro Abs: 8.3 10*3/uL — ABNORMAL HIGH (ref 1.4–7.7)
Neutrophils Relative %: 73.8 % (ref 43.0–77.0)
Platelets: 348 10*3/uL (ref 150.0–400.0)
RBC: 4.55 Mil/uL (ref 3.87–5.11)
RDW: 17.1 % — ABNORMAL HIGH (ref 11.5–15.5)
WBC: 11.3 10*3/uL — AB (ref 4.0–10.5)

## 2015-08-03 LAB — LIPID PANEL
CHOL/HDL RATIO: 4
Cholesterol: 170 mg/dL (ref 0–200)
HDL: 45.9 mg/dL (ref >39.00)
LDL Cholesterol: 106 mg/dL — ABNORMAL HIGH (ref 0–99)
NONHDL: 123.63
Triglycerides: 88 mg/dL (ref 0.0–149.0)
VLDL: 17.6 mg/dL (ref 0.0–40.0)

## 2015-08-03 LAB — URINALYSIS, ROUTINE W REFLEX MICROSCOPIC
Bilirubin Urine: NEGATIVE
Hgb urine dipstick: NEGATIVE
KETONES UR: NEGATIVE
Leukocytes, UA: NEGATIVE
NITRITE: NEGATIVE
PH: 6.5 (ref 5.0–8.0)
Specific Gravity, Urine: 1.02 (ref 1.000–1.030)
Total Protein, Urine: NEGATIVE
Urine Glucose: NEGATIVE
Urobilinogen, UA: 0.2 (ref 0.0–1.0)

## 2015-08-03 LAB — BASIC METABOLIC PANEL
BUN: 10 mg/dL (ref 6–23)
CALCIUM: 9.3 mg/dL (ref 8.4–10.5)
CO2: 26 mEq/L (ref 19–32)
Chloride: 105 mEq/L (ref 96–112)
Creatinine, Ser: 0.85 mg/dL (ref 0.40–1.20)
GFR: 93.88 mL/min (ref >60.00)
Glucose, Bld: 97 mg/dL (ref 70–99)
Potassium: 3.9 mEq/L (ref 3.5–5.1)
Sodium: 140 mEq/L (ref 135–145)

## 2015-08-03 LAB — HEPATIC FUNCTION PANEL
ALK PHOS: 70 U/L (ref 39–117)
ALT: 13 U/L (ref 0–35)
AST: 17 U/L (ref 0–37)
Albumin: 3.8 g/dL (ref 3.5–5.2)
BILIRUBIN TOTAL: 0.3 mg/dL (ref 0.2–1.2)
Bilirubin, Direct: 0.1 mg/dL (ref 0.0–0.3)
Total Protein: 7.4 g/dL (ref 6.0–8.3)

## 2015-08-03 LAB — TSH: TSH: 1.17 u[IU]/mL (ref 0.35–4.50)

## 2015-08-04 ENCOUNTER — Telehealth: Payer: Self-pay | Admitting: Family

## 2015-08-04 DIAGNOSIS — D72829 Elevated white blood cell count, unspecified: Secondary | ICD-10-CM

## 2015-08-04 NOTE — Telephone Encounter (Signed)
Please advise pt that I reviewed her record and see that she no showed her ID appointment.  I would recommend that she reschedule.  Also, I reviewed her blood work.  WBC remains mildly elevated.  I would like her to meet with hematology due to this finding.  I have pended referral below. Anemia is stable.

## 2015-08-05 NOTE — Telephone Encounter (Signed)
Notified pt and she voices understanding and is agreeable to referral. States she went to the health dept instead of infectious disease. She will call them and have them fax over their information / results.

## 2015-08-16 ENCOUNTER — Telehealth: Payer: Self-pay

## 2015-08-16 ENCOUNTER — Ambulatory Visit (INDEPENDENT_AMBULATORY_CARE_PROVIDER_SITE_OTHER): Payer: PRIVATE HEALTH INSURANCE | Admitting: Family Medicine

## 2015-08-16 VITALS — BP 147/96 | HR 99

## 2015-08-16 DIAGNOSIS — I10 Essential (primary) hypertension: Secondary | ICD-10-CM

## 2015-08-16 NOTE — Telephone Encounter (Signed)
RN blood check note reviewed. Agree with documention and plan. 

## 2015-08-16 NOTE — Patient Instructions (Signed)
Increase Valsartan-HCTZ 80-12.5 mg to 1 whole tablet daily. Monitor caffine intake due to elevated pulse. Return to office in 2 weeks. Please call to schedule appointment.

## 2015-08-16 NOTE — Progress Notes (Signed)
.  Pre visit review using our clinic review tool, if applicable. No additional management support is needed unless otherwise documented below in the visit note.   Patient in for BP check. Blood pressure elevated at last visit. Medications changed. RN blood check note reviewed. Agree with documention and plan.

## 2015-08-16 NOTE — Telephone Encounter (Signed)
Patient in for BP check today. BP = (1)144/97 (2) 147/96. Pulse 102 then 99 with second BP check. Per Dr. Charlett Blake increase BP medication to 1 tablet daily. Monitor caffine intake due to increase in pulse and return to office for BP check in 2 weeks. Left message on patients answering regarding this. Asked patient to call back to schedule 2 weeks appointment.

## 2015-09-08 ENCOUNTER — Ambulatory Visit (HOSPITAL_BASED_OUTPATIENT_CLINIC_OR_DEPARTMENT_OTHER): Payer: PRIVATE HEALTH INSURANCE | Admitting: Hematology & Oncology

## 2015-09-08 ENCOUNTER — Encounter: Payer: Self-pay | Admitting: Hematology & Oncology

## 2015-09-08 ENCOUNTER — Other Ambulatory Visit (HOSPITAL_BASED_OUTPATIENT_CLINIC_OR_DEPARTMENT_OTHER): Payer: PRIVATE HEALTH INSURANCE

## 2015-09-08 ENCOUNTER — Ambulatory Visit: Payer: PRIVATE HEALTH INSURANCE

## 2015-09-08 VITALS — BP 147/95 | HR 95 | Temp 98.6°F | Resp 16 | Ht 63.0 in | Wt 200.0 lb

## 2015-09-08 DIAGNOSIS — D500 Iron deficiency anemia secondary to blood loss (chronic): Secondary | ICD-10-CM | POA: Diagnosis not present

## 2015-09-08 DIAGNOSIS — D72829 Elevated white blood cell count, unspecified: Secondary | ICD-10-CM

## 2015-09-08 DIAGNOSIS — N920 Excessive and frequent menstruation with regular cycle: Secondary | ICD-10-CM | POA: Diagnosis not present

## 2015-09-08 DIAGNOSIS — D508 Other iron deficiency anemias: Secondary | ICD-10-CM

## 2015-09-08 LAB — CBC WITH DIFFERENTIAL (CANCER CENTER ONLY)
BASO#: 0 10*3/uL (ref 0.0–0.2)
BASO%: 0.3 % (ref 0.0–2.0)
EOS%: 1.3 % (ref 0.0–7.0)
Eosinophils Absolute: 0.1 10*3/uL (ref 0.0–0.5)
HCT: 32.7 % — ABNORMAL LOW (ref 34.8–46.6)
HGB: 10.2 g/dL — ABNORMAL LOW (ref 11.6–15.9)
LYMPH#: 2 10*3/uL (ref 0.9–3.3)
LYMPH%: 20.7 % (ref 14.0–48.0)
MCH: 24.3 pg — ABNORMAL LOW (ref 26.0–34.0)
MCHC: 31.2 g/dL — ABNORMAL LOW (ref 32.0–36.0)
MCV: 78 fL — AB (ref 81–101)
MONO#: 0.8 10*3/uL (ref 0.1–0.9)
MONO%: 8.1 % (ref 0.0–13.0)
NEUT#: 6.8 10*3/uL — ABNORMAL HIGH (ref 1.5–6.5)
NEUT%: 69.6 % (ref 39.6–80.0)
PLATELETS: 301 10*3/uL (ref 145–400)
RBC: 4.2 10*6/uL (ref 3.70–5.32)
RDW: 15.1 % (ref 11.1–15.7)
WBC: 9.8 10*3/uL (ref 3.9–10.0)

## 2015-09-08 LAB — CHCC SATELLITE - SMEAR

## 2015-09-08 NOTE — Progress Notes (Signed)
Referral MD  Reason for Referral: Transient leukocytosis with iron deficiency anemia secondary to menorrhagia  Chief Complaint  Patient presents with  . Follow-up  : I'm here because I have heavy cycles.  HPI: [name] is a very nice 45 year old African American female. 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  She is going to have a hysterectomy in about 3 weeks or so. This is because of heavy monthly cycles.  She has been found to have some leukocytosis. Going through her medical record, she had a white cell count of 20,000 back in August. She was a little bit anemic with a low MCV.  In December, her white count come down to 11.1. Hemoglobin 11.2 and platelet count 248,000. MCV was 78. She has normal renal function. She has normal liver function. A TSH was okay. She does have some hyperlipidemia.  She is to have a hysterectomy in February because of menorrhagia.  She was, referred to the [facility] for the leukocytosis. She's had no problems with weight loss or weight gain. She's had no fever. She's had no rashes. She's had no nausea or vomiting. She's had no change in medications. She's had no leg swelling.  She does get her mammograms routinely.  She used to smoke but stopped about 5 years ago. She really does not drink.  She has a son and daughter. The son has sickle cell trait.  She is not a vegetarian.  Overall, her performance status is ECOG 0.                     Past Medical History  Diagnosis Date  . Hypertension   . Frequent episodic tension-type headache   . Diverticulitis   . Gestational diabetes   :  Past Surgical History  Procedure Laterality Date  . Cesarean section  1991, 1999  . Tubal ligation  1999  :   Current outpatient prescriptions:  .  valsartan-hydrochlorothiazide (DIOVAN-HCT) 80-12.5 MG tablet, TAKE 1/2 TABLET BY MOUTH DAILY, Disp: 15 tablet, Rfl: 5:  :  Allergies  Allergen Reactions  .  Simvastatin     cramping  :  Family History  Problem Relation Age of Onset  . Hypertension Mother   . Heart disease Father   . Heart disease Paternal Uncle   . Hypertension Maternal Grandmother   . Diabetes Maternal Grandmother   . Stroke Paternal Grandmother   . Hypertension Paternal Grandmother   . Diabetes Paternal Grandmother   . Heart disease Paternal Grandfather   :  Social History   Social History  . Marital Status: Married    Spouse Name: N/A  . Number of Children: 2  . Years of Education: N/A   Occupational History  . HOUSEHOLD COORDINATOR    Social History Main Topics  . Smoking status: Former Smoker -- 10 years    Types: Cigarettes    Quit date: 03/10/2010  . Smokeless tobacco: Never Used  . Alcohol Use: No  . Drug Use: No  . Sexual Activity: Yes    Birth Control/ Protection: Surgical   Other Topics Concern  . Not on file   Social History Narrative   Caffeine Use:  1 daily   Regular exercise:  No   Married   2 children Son age 74 and daughter age 74   Works at [employer] as a Nurse, learning disability.   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     Complete 12 th grade              :  Pertinent items are noted in HPI.  Exam: @IPVITALS @  well-developed and well-nourished Afro-American female in no obvious distress. Vital signs show temperature of 98.6. Pulse 95. Blood pressure 147/95. Weight is 200 pounds. Head and neck exam shows no ocular or oral lesions. There are no palpable cervical or supraclavicular lymph nodes. Lungs are clear. Cardiac exam regular rate and rhythm with no murmurs, rubs or bruits. Abdomen is soft. She has good bowel sounds. There is no fluid wave. There is no palpable liver or spleen tip. Back exam shows no tenderness over the spine, ribs or hips. Extremities shows no clubbing, cyanosis or edema. Skin exam shows no rashes, ecchymoses or petechia. Neurological exam shows no focal neurological deficits.    Recent Labs  09/08/15 1504   WBC 9.8  HGB 10.2*  HCT 32.7*  PLT 301   No results for input(s): NA, K, CL, CO2, GLUCOSE, BUN, CREATININE, CALCIUM in the last 72 hours.  Blood smear review:  Slightly microcytic and hypochromic red blood cells. There's some slight anisocytosis. She has no target cells. I see no nucleated red blood cells. She has no schistocytes or spherocytes. White cells been normal in morphology maturation. There is no immature myeloid or lymphoid forms. There is no hypersegmented polys. Platelets are adequate in number and size.  Pathology: None     Assessment and Plan:  [name] is a 45 year old African American female. She has microcytic anemia. I have to believe that she is iron deficient by her blood smear. Back in August, and iron study was 32. I'm sure that it is lower now.  I think that she will need IV iron in order to get her blood count back up before her surgery. Per graph we will see what her iron studies show. This will give [place] an idea as to how much iron to give her and went to have her come back.  I don't think she needs a bone marrow test. I don't she needs any scans or endoscopies. Pravastatin 45 minutes with her. She is very nice. She understands very well what is going on.  We probably don't have to get her back until after her hysterectomy. Again, we will see what her studies show and probably get her back for iron next week.

## 2015-09-09 LAB — COMPREHENSIVE METABOLIC PANEL (CC13)
ALBUMIN: 3.9 g/dL (ref 3.5–5.5)
ALT: 13 IU/L (ref 0–32)
AST (SGOT): 15 IU/L (ref 0–40)
Albumin/Globulin Ratio: 1.1 (ref 1.1–2.5)
Alkaline Phosphatase, S: 82 IU/L (ref 39–117)
BUN / CREAT RATIO: 18 (ref 9–23)
BUN: 14 mg/dL (ref 6–24)
Bilirubin Total: 0.3 mg/dL (ref 0.0–1.2)
CO2: 25 mmol/L (ref 18–29)
CREATININE: 0.77 mg/dL (ref 0.57–1.00)
Calcium, Ser: 8.7 mg/dL (ref 8.7–10.2)
Chloride, Ser: 105 mmol/L (ref 96–106)
GFR calc non Af Amer: 95 mL/min/{1.73_m2} (ref >59)
GFR, EST AFRICAN AMERICAN: 109 mL/min/{1.73_m2} (ref >59)
GLUCOSE: 127 mg/dL — AB (ref 65–99)
Globulin, Total: 3.6 g/dL (ref 1.5–4.5)
Potassium, Ser: 3.9 mmol/L (ref 3.5–5.2)
Sodium: 138 mmol/L (ref 134–144)
TOTAL PROTEIN: 7.5 g/dL (ref 6.0–8.5)

## 2015-09-09 LAB — IRON AND TIBC
%SAT: 20 % — ABNORMAL LOW (ref 21–57)
Iron: 88 ug/dL (ref 41–142)
TIBC: 429 ug/dL (ref 236–444)
UIBC: 341 ug/dL (ref 120–384)

## 2015-09-09 LAB — ERYTHROPOIETIN: Erythropoietin: 22.9 m[IU]/mL — ABNORMAL HIGH (ref 2.6–18.5)

## 2015-09-09 LAB — FERRITIN: FERRITIN: 8 ng/mL — AB (ref 9–269)

## 2015-09-09 LAB — RETICULOCYTES: RETICULOCYTE COUNT: 1.3 % (ref 0.6–2.6)

## 2015-09-12 LAB — HEMOGLOBINOPATHY EVALUATION
HGB C: 0 %
HGB S: 0 %
Hemoglobin A2 Quantitation: 1.9 % (ref 0.7–3.1)
Hemoglobin F Quantitation: 0 % (ref 0.0–2.0)
Hgb A: 98.1 % — ABNORMAL HIGH (ref 94.0–98.0)

## 2015-09-14 ENCOUNTER — Encounter: Payer: Self-pay | Admitting: Family

## 2015-09-14 ENCOUNTER — Telehealth: Payer: Self-pay | Admitting: Family

## 2015-09-14 ENCOUNTER — Other Ambulatory Visit: Payer: Self-pay | Admitting: Obstetrics & Gynecology

## 2015-09-14 ENCOUNTER — Ambulatory Visit (INDEPENDENT_AMBULATORY_CARE_PROVIDER_SITE_OTHER): Payer: PRIVATE HEALTH INSURANCE | Admitting: Family

## 2015-09-14 VITALS — BP 128/92 | HR 73 | Temp 98.5°F | Resp 16 | Ht 63.0 in | Wt 198.0 lb

## 2015-09-14 DIAGNOSIS — R739 Hyperglycemia, unspecified: Secondary | ICD-10-CM

## 2015-09-14 DIAGNOSIS — I10 Essential (primary) hypertension: Secondary | ICD-10-CM

## 2015-09-14 DIAGNOSIS — A539 Syphilis, unspecified: Secondary | ICD-10-CM

## 2015-09-14 DIAGNOSIS — R7303 Prediabetes: Secondary | ICD-10-CM

## 2015-09-14 MED ORDER — VALSARTAN-HYDROCHLOROTHIAZIDE 80-12.5 MG PO TABS: 1.0000 | ORAL_TABLET | Freq: Every day | ORAL | Status: DC

## 2015-09-14 NOTE — Telephone Encounter (Signed)
See mychart.  

## 2015-09-14 NOTE — Progress Notes (Signed)
Pre visit review using our clinic review tool, if applicable. No additional management support is needed unless otherwise documented below in the visit note. 

## 2015-09-14 NOTE — Assessment & Plan Note (Signed)
BP improved on current dose of diovan hct.

## 2015-09-14 NOTE — Addendum Note (Signed)
Addended by: Debbrah Alar on: 09/14/2015 03:22 PM   Modules accepted: Orders

## 2015-09-14 NOTE — Assessment & Plan Note (Signed)
Obtain follow up A1C. Discussed diet, exercise, weight loss.

## 2015-09-14 NOTE — Progress Notes (Signed)
   Subjective:    Patient ID: Kristin Herrera, female    DOB: 09-Dec-1970, 45 y.o.   MRN: [id number]  HPI  [name] is a 45 yr old female presents today for follow up of her hypertension.  HTN- last visit her diovan hct was increased from a half tablet to a full tablet.  She denies CP/SOB or swelling.  BMET was drawn a few weeks ago- note was made of hyperglycemia.  Last A1C 2/16 was 6.3.  BP Readings from Last 3 Encounters:  09/14/15 128/92  09/08/15 147/95  08/16/15 147/96   Review of Systems See HPI  Past Medical History  Diagnosis Date  . Hypertension   . Frequent episodic tension-type headache   . Diverticulitis   . Gestational diabetes     Social History   Social History  . Marital Status: Married    Spouse Name: N/A  . Number of Children: 2  . Years of Education: N/A   Occupational History  . HOUSEHOLD COORDINATOR    Social History Main Topics  . Smoking status: Former Smoker -- 10 years    Types: Cigarettes    Quit date: 03/10/2010  . Smokeless tobacco: Never Used  . Alcohol Use: No  . Drug Use: No  . Sexual Activity: Yes    Birth Control/ Protection: Surgical   Other Topics Concern  . Not on file   Social History Narrative   Caffeine Use:  1 daily   Regular exercise:  No   Married   2 children Son age 61 and daughter age 61   Works at [employer] as a Nurse, learning disability.   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     Complete 12 th grade                Past Surgical History  Procedure Laterality Date  . Cesarean section  1991, 1999  . Tubal ligation  1999    Family History  Problem Relation Age of Onset  . Hypertension Mother   . Heart disease Father   . Heart disease Paternal Uncle   . Hypertension Maternal Grandmother   . Diabetes Maternal Grandmother   . Stroke Paternal Grandmother   . Hypertension Paternal Grandmother   . Diabetes Paternal Grandmother   . Heart disease Paternal Grandfather     Allergies  Allergen Reactions  .  Simvastatin     cramping    No current outpatient prescriptions on file prior to visit.   No current facility-administered medications on file prior to visit.    BP 128/92 mmHg  Pulse 73  Temp(Src) 98.5 F (36.9 C) (Oral)  Resp 16  Ht 5\' 3"  (1.6 m)  Wt 198 lb (89.812 kg)  BMI 35.08 kg/m2  SpO2 100%  LMP 09/08/2015       Objective:   Physical Exam d Constitutional: She is oriented to person, place, and time. She appears well-developed and well-nourished.  Cardiovascular: Normal rate, regular rhythm and normal heart sounds.   No murmur heard. Pulmonary/Chest: Effort normal and breath sounds normal. No respiratory distress. She has no wheezes.  Musculoskeletal: She exhibits no edema.  Neurological: She is alert and oriented to person, place, and time.  Psychiatric: She has a normal mood and affect. Her behavior is normal. Judgment and thought content normal.          Assessment & Plan:

## 2015-09-14 NOTE — Patient Instructions (Signed)
Please complete lab work prior to leaving. Work on healthy diet, exercise, weight loss.   

## 2015-09-15 ENCOUNTER — Encounter (HOSPITAL_COMMUNITY)
Admission: RE | Admit: 2015-09-15 | Discharge: 2015-09-15 | Disposition: A | Discharge status: Home / Self Care | Payer: PRIVATE HEALTH INSURANCE | Source: Ambulatory Visit | Attending: Obstetrics & Gynecology | Admitting: Obstetrics & Gynecology

## 2015-09-15 ENCOUNTER — Encounter (HOSPITAL_COMMUNITY): Payer: Self-pay

## 2015-09-15 DIAGNOSIS — Z01812 Encounter for preprocedural laboratory examination: Secondary | ICD-10-CM | POA: Insufficient documentation

## 2015-09-15 DIAGNOSIS — D259 Leiomyoma of uterus, unspecified: Secondary | ICD-10-CM | POA: Insufficient documentation

## 2015-09-15 DIAGNOSIS — N920 Excessive and frequent menstruation with regular cycle: Secondary | ICD-10-CM | POA: Insufficient documentation

## 2015-09-15 LAB — TYPE AND SCREEN
ABO/RH(D): O POS
ANTIBODY SCREEN: NEGATIVE

## 2015-09-15 LAB — ABO/RH: ABO/RH(D): O POS

## 2015-09-15 NOTE — Patient Instructions (Addendum)
     Your procedure is scheduled on: Sep 29 2015   Enter through the Main Entrance of [facility] at: 6AM  Pick up the phone at the desk and dial [phone] and inform us of your arrival.  Please call this number if you have any problems the morning of surgery: [phone]  Remember: DO NOT EAT OR DRINK AFTER MIDNIGHT FEB 1 (WEDNESDAY)  Take these medicines the morning of surgery with a SIP OF WATER: TAKE BLOOD PRESSURE MEDS  Do not wear jewelry, make-up, or FINGER nail polish No metal in your hair or on your body. Do not wear lotions, powders, perfumes.  You may wear deodorant.  Do not bring valuables to the hospital. Contacts, dentures or bridgework may not be worn into surgery.  Leave suitcase in the car. After Surgery it may be brought to your room. For patients being admitted to the hospital, checkout time is 11:00am the day of discharge.

## 2015-09-16 LAB — HEMOGLOBIN A1C: HEMOGLOBIN A1C: 6.1 % (ref 4.6–6.5)

## 2015-09-18 ENCOUNTER — Encounter: Payer: Self-pay | Admitting: Family

## 2015-09-28 HISTORY — PX: ABDOMINAL HYSTERECTOMY: SHX81

## 2015-09-28 NOTE — Anesthesia Preprocedure Evaluation (Addendum)
Anesthesia Evaluation  Patient identified by MRN, date of birth, ID band Patient awake    Reviewed: Allergy & Precautions, NPO status , Patient's Chart, lab work & pertinent test results  History of Anesthesia Complications Negative for: history of anesthetic complications  Airway Mallampati: II  TM Distance: >3 FB Neck ROM: Full    Dental  (+) Teeth Intact, Dental Advisory Given   Pulmonary former smoker,    Pulmonary exam normal breath sounds clear to auscultation       Cardiovascular hypertension, Pt. on medications negative cardio ROS Normal cardiovascular exam Rhythm:Regular Rate:Normal     Neuro/Psych  Headaches,    GI/Hepatic negative GI ROS, Neg liver ROS,   Endo/Other  diabetesHistory of gestational diabetes; pre-diabetic Obesity   Renal/GU negative Renal ROS     Musculoskeletal negative musculoskeletal ROS (+)   Abdominal   Peds  Hematology  (+) Blood dyscrasia, anemia ,   Anesthesia Other Findings Day of surgery medications reviewed with the patient.  Reproductive/Obstetrics negative OB ROS                          Anesthesia Physical Anesthesia Plan  ASA: II  Anesthesia Plan: General   Post-op Pain Management:    Induction: Intravenous  Airway Management Planned: Oral ETT  Additional Equipment:   Intra-op Plan:   Post-operative Plan: Extubation in OR  Informed Consent: I have reviewed the patients History and Physical, chart, labs and discussed the procedure including the risks, benefits and alternatives for the proposed anesthesia with the patient or authorized representative who has indicated his/her understanding and acceptance.   Dental advisory given  Plan Discussed with: CRNA  Anesthesia Plan Comments: (Risks/benefits of general anesthesia discussed with patient including risk of damage to teeth, lips, gum, and tongue, nausea/vomiting, allergic reactions  to medications, and the possibility of heart attack, stroke and death.  All patient questions answered.  Patient wishes to proceed.)        Anesthesia Quick Evaluation

## 2015-09-29 ENCOUNTER — Encounter (HOSPITAL_COMMUNITY): Payer: Self-pay | Admitting: *Deleted

## 2015-09-29 ENCOUNTER — Encounter (HOSPITAL_COMMUNITY)
Admission: RE | Disposition: A | Discharge status: Home / Self Care | Payer: Self-pay | Source: Ambulatory Visit | Attending: Obstetrics & Gynecology

## 2015-09-29 ENCOUNTER — Ambulatory Visit (HOSPITAL_COMMUNITY): Payer: PRIVATE HEALTH INSURANCE | Admitting: Anesthesiology

## 2015-09-29 ENCOUNTER — Ambulatory Visit (HOSPITAL_COMMUNITY)
Admission: RE | Admit: 2015-09-29 | Discharge: 2015-09-30 | Disposition: A | Discharge status: Home / Self Care | Payer: PRIVATE HEALTH INSURANCE | Source: Ambulatory Visit | Attending: Obstetrics & Gynecology | Admitting: Obstetrics & Gynecology

## 2015-09-29 DIAGNOSIS — Z6835 Body mass index (BMI) 35.0-35.9, adult: Secondary | ICD-10-CM | POA: Diagnosis not present

## 2015-09-29 DIAGNOSIS — Z8719 Personal history of other diseases of the digestive system: Secondary | ICD-10-CM | POA: Diagnosis not present

## 2015-09-29 DIAGNOSIS — D649 Anemia, unspecified: Secondary | ICD-10-CM | POA: Diagnosis not present

## 2015-09-29 DIAGNOSIS — I10 Essential (primary) hypertension: Secondary | ICD-10-CM | POA: Insufficient documentation

## 2015-09-29 DIAGNOSIS — Z87891 Personal history of nicotine dependence: Secondary | ICD-10-CM | POA: Insufficient documentation

## 2015-09-29 DIAGNOSIS — D259 Leiomyoma of uterus, unspecified: Secondary | ICD-10-CM | POA: Insufficient documentation

## 2015-09-29 DIAGNOSIS — E669 Obesity, unspecified: Secondary | ICD-10-CM | POA: Diagnosis not present

## 2015-09-29 DIAGNOSIS — Z888 Allergy status to other drugs, medicaments and biological substances status: Secondary | ICD-10-CM | POA: Diagnosis not present

## 2015-09-29 DIAGNOSIS — N800 Endometriosis of uterus: Secondary | ICD-10-CM | POA: Insufficient documentation

## 2015-09-29 DIAGNOSIS — N920 Excessive and frequent menstruation with regular cycle: Secondary | ICD-10-CM | POA: Insufficient documentation

## 2015-09-29 DIAGNOSIS — Z9889 Other specified postprocedural states: Secondary | ICD-10-CM

## 2015-09-29 HISTORY — PX: ROBOTIC ASSISTED TOTAL HYSTERECTOMY WITH SALPINGECTOMY: SHX6679

## 2015-09-29 LAB — PREGNANCY, URINE: PREG TEST UR: NEGATIVE

## 2015-09-29 SURGERY — ROBOTIC ASSISTED TOTAL HYSTERECTOMY WITH SALPINGECTOMY
Anesthesia: General | Site: Abdomen | Laterality: Bilateral

## 2015-09-29 MED ORDER — ROCURONIUM BROMIDE 100 MG/10ML IV SOLN
INTRAVENOUS | Status: AC
  Filled 2015-09-29: qty 1

## 2015-09-29 MED ORDER — PHENYLEPHRINE HCL 10 MG/ML IJ SOLN
INTRAMUSCULAR | Status: DC | PRN
  Administered 2015-09-29: 40 ug via INTRAVENOUS

## 2015-09-29 MED ORDER — IBUPROFEN 600 MG PO TABS
600.0000 mg | ORAL_TABLET | Freq: Four times a day (QID) | ORAL | Status: DC | PRN
  Administered 2015-09-29 – 2015-09-30 (×2): 600 mg via ORAL
  Filled 2015-09-29 (×2): qty 1

## 2015-09-29 MED ORDER — DEXAMETHASONE SODIUM PHOSPHATE 4 MG/ML IJ SOLN
INTRAMUSCULAR | Status: AC
  Filled 2015-09-29: qty 1

## 2015-09-29 MED ORDER — FENTANYL CITRATE (PF) 100 MCG/2ML IJ SOLN
INTRAMUSCULAR | Status: DC | PRN
  Administered 2015-09-29: 50 ug via INTRAVENOUS
  Administered 2015-09-29: 100 ug via INTRAVENOUS
  Administered 2015-09-29: 50 ug via INTRAVENOUS

## 2015-09-29 MED ORDER — VALSARTAN-HYDROCHLOROTHIAZIDE 80-12.5 MG PO TABS: 1.0000 | ORAL_TABLET | Freq: Every day | ORAL | Status: DC

## 2015-09-29 MED ORDER — CEFAZOLIN SODIUM-DEXTROSE 2-3 GM-% IV SOLR
2.0000 g | INTRAVENOUS | Status: AC
  Administered 2015-09-29: 2 g via INTRAVENOUS

## 2015-09-29 MED ORDER — ONDANSETRON HCL 4 MG/2ML IJ SOLN
INTRAMUSCULAR | Status: DC | PRN
  Administered 2015-09-29: 4 mg via INTRAVENOUS

## 2015-09-29 MED ORDER — IRBESARTAN 75 MG PO TABS
75.0000 mg | ORAL_TABLET | Freq: Every day | ORAL | Status: DC
  Administered 2015-09-30: 75 mg via ORAL
  Filled 2015-09-29 (×2): qty 1

## 2015-09-29 MED ORDER — NEOSTIGMINE METHYLSULFATE 10 MG/10ML IV SOLN
INTRAVENOUS | Status: DC | PRN
  Administered 2015-09-29: 4 mg via INTRAVENOUS

## 2015-09-29 MED ORDER — MIDAZOLAM HCL 2 MG/2ML IJ SOLN
INTRAMUSCULAR | Status: DC | PRN
  Administered 2015-09-29: 2 mg via INTRAVENOUS

## 2015-09-29 MED ORDER — BUPIVACAINE HCL (PF) 0.25 % IJ SOLN
INTRAMUSCULAR | Status: DC | PRN
  Administered 2015-09-29 (×2): 10 mL

## 2015-09-29 MED ORDER — HYDROCHLOROTHIAZIDE 12.5 MG PO CAPS
12.5000 mg | ORAL_CAPSULE | Freq: Every day | ORAL | Status: DC
  Administered 2015-09-30: 12.5 mg via ORAL
  Filled 2015-09-29 (×2): qty 1

## 2015-09-29 MED ORDER — PROPOFOL 10 MG/ML IV BOLUS
INTRAVENOUS | Status: AC
  Filled 2015-09-29: qty 20

## 2015-09-29 MED ORDER — FENTANYL CITRATE (PF) 250 MCG/5ML IJ SOLN
INTRAMUSCULAR | Status: AC
  Filled 2015-09-29: qty 5

## 2015-09-29 MED ORDER — HYDROMORPHONE HCL 1 MG/ML IJ SOLN
1.0000 mg | INTRAMUSCULAR | Status: DC | PRN
  Administered 2015-09-29: 1 mg via INTRAVENOUS
  Filled 2015-09-29: qty 1

## 2015-09-29 MED ORDER — VASOPRESSIN 20 UNIT/ML IV SOLN
INTRAVENOUS | Status: AC
Start: 2015-09-29 — End: 2015-09-29
  Filled 2015-09-29: qty 1

## 2015-09-29 MED ORDER — LIDOCAINE HCL (CARDIAC) 20 MG/ML IV SOLN
INTRAVENOUS | Status: AC
  Filled 2015-09-29: qty 5

## 2015-09-29 MED ORDER — ROPIVACAINE HCL 5 MG/ML IJ SOLN
INTRAMUSCULAR | Status: AC
  Filled 2015-09-29: qty 30

## 2015-09-29 MED ORDER — LIDOCAINE HCL (CARDIAC) 20 MG/ML IV SOLN
INTRAVENOUS | Status: DC | PRN
  Administered 2015-09-29: 100 mg via INTRAVENOUS

## 2015-09-29 MED ORDER — FENTANYL CITRATE (PF) 100 MCG/2ML IJ SOLN
25.0000 ug | INTRAMUSCULAR | Status: DC | PRN
  Administered 2015-09-29: 50 ug via INTRAVENOUS
  Administered 2015-09-29: 25 ug via INTRAVENOUS

## 2015-09-29 MED ORDER — SCOPOLAMINE 1 MG/3DAYS TD PT72
MEDICATED_PATCH | TRANSDERMAL | Status: AC
  Administered 2015-09-29: 1.5 mg via TRANSDERMAL
  Filled 2015-09-29: qty 1

## 2015-09-29 MED ORDER — GLYCOPYRROLATE 0.2 MG/ML IJ SOLN
INTRAMUSCULAR | Status: AC
  Filled 2015-09-29: qty 3

## 2015-09-29 MED ORDER — LACTATED RINGERS IV SOLN: INTRAVENOUS | Status: DC

## 2015-09-29 MED ORDER — PROPOFOL 10 MG/ML IV BOLUS
INTRAVENOUS | Status: DC | PRN
  Administered 2015-09-29: 170 mg via INTRAVENOUS

## 2015-09-29 MED ORDER — CEFAZOLIN SODIUM-DEXTROSE 2-3 GM-% IV SOLR
INTRAVENOUS | Status: AC
  Filled 2015-09-29: qty 50

## 2015-09-29 MED ORDER — MIDAZOLAM HCL 2 MG/2ML IJ SOLN
INTRAMUSCULAR | Status: AC
  Filled 2015-09-29: qty 2

## 2015-09-29 MED ORDER — LACTATED RINGERS IV SOLN
INTRAVENOUS | Status: DC
  Administered 2015-09-29: 07:00:00 via INTRAVENOUS
  Administered 2015-09-29: 1000 mL via INTRAVENOUS
  Administered 2015-09-29: 06:00:00 via INTRAVENOUS

## 2015-09-29 MED ORDER — SODIUM CHLORIDE 0.9 % IV SOLN
INTRAVENOUS | Status: DC | PRN
  Administered 2015-09-29: 80 mL

## 2015-09-29 MED ORDER — PHENYLEPHRINE 40 MCG/ML (10ML) SYRINGE FOR IV PUSH (FOR BLOOD PRESSURE SUPPORT)
PREFILLED_SYRINGE | INTRAVENOUS | Status: AC
  Filled 2015-09-29: qty 10

## 2015-09-29 MED ORDER — BUPIVACAINE HCL (PF) 0.25 % IJ SOLN
INTRAMUSCULAR | Status: AC
  Filled 2015-09-29: qty 30

## 2015-09-29 MED ORDER — KETOROLAC TROMETHAMINE 30 MG/ML IJ SOLN
INTRAMUSCULAR | Status: DC | PRN
  Administered 2015-09-29: 30 mg via INTRAVENOUS

## 2015-09-29 MED ORDER — GLYCOPYRROLATE 0.2 MG/ML IJ SOLN
INTRAMUSCULAR | Status: DC | PRN
  Administered 2015-09-29: 0.6 mg via INTRAVENOUS

## 2015-09-29 MED ORDER — PROMETHAZINE HCL 25 MG/ML IJ SOLN: 6.2500 mg | INTRAMUSCULAR | Status: DC | PRN

## 2015-09-29 MED ORDER — ROCURONIUM BROMIDE 100 MG/10ML IV SOLN
INTRAVENOUS | Status: DC | PRN
  Administered 2015-09-29: 50 mg via INTRAVENOUS
  Administered 2015-09-29: 20 mg via INTRAVENOUS
  Administered 2015-09-29: 10 mg via INTRAVENOUS

## 2015-09-29 MED ORDER — FENTANYL CITRATE (PF) 100 MCG/2ML IJ SOLN
INTRAMUSCULAR | Status: AC
  Filled 2015-09-29: qty 2

## 2015-09-29 MED ORDER — ONDANSETRON HCL 4 MG/2ML IJ SOLN
INTRAMUSCULAR | Status: AC
  Filled 2015-09-29: qty 2

## 2015-09-29 MED ORDER — DEXAMETHASONE SODIUM PHOSPHATE 10 MG/ML IJ SOLN
INTRAMUSCULAR | Status: DC | PRN
  Administered 2015-09-29: 4 mg via INTRAVENOUS

## 2015-09-29 MED ORDER — OXYCODONE-ACETAMINOPHEN 5-325 MG PO TABS
1.0000 | ORAL_TABLET | ORAL | Status: DC | PRN
  Administered 2015-09-29 – 2015-09-30 (×2): 1 via ORAL
  Filled 2015-09-29 (×3): qty 1

## 2015-09-29 MED ORDER — SODIUM CHLORIDE 0.9 % IJ SOLN
INTRAMUSCULAR | Status: AC
  Filled 2015-09-29: qty 50

## 2015-09-29 MED ORDER — SCOPOLAMINE 1 MG/3DAYS TD PT72
1.0000 | MEDICATED_PATCH | Freq: Once | TRANSDERMAL | Status: DC
  Administered 2015-09-29: 1.5 mg via TRANSDERMAL

## 2015-09-29 MED ORDER — KETOROLAC TROMETHAMINE 30 MG/ML IJ SOLN
INTRAMUSCULAR | Status: AC
  Filled 2015-09-29: qty 1

## 2015-09-29 MED ORDER — NEOSTIGMINE METHYLSULFATE 10 MG/10ML IV SOLN
INTRAVENOUS | Status: AC
  Filled 2015-09-29: qty 1

## 2015-09-29 SURGICAL SUPPLY — 56 items
BARRIER ADHS 3X4 INTERCEED (GAUZE/BANDAGES/DRESSINGS) ×1 IMPLANT
BRR ADH 4X3 ABS CNTRL BYND (GAUZE/BANDAGES/DRESSINGS)
CATH FOLEY 3WAY  5CC 16FR (CATHETERS) ×2
CATH FOLEY 3WAY 5CC 16FR (CATHETERS) ×1 IMPLANT
CLOTH BEACON ORANGE TIMEOUT ST (SAFETY) ×3 IMPLANT
CONT PATH 16OZ SNAP LID 3702 (MISCELLANEOUS) ×3 IMPLANT
COVER BACK TABLE 60X90IN (DRAPES) ×6 IMPLANT
COVER TIP SHEARS 8 DVNC (MISCELLANEOUS) ×1 IMPLANT
COVER TIP SHEARS 8MM DA VINCI (MISCELLANEOUS) ×2
DECANTER SPIKE VIAL GLASS SM (MISCELLANEOUS) ×3 IMPLANT
DURAPREP 26ML APPLICATOR (WOUND CARE) ×3 IMPLANT
ELECT REM PT RETURN 9FT ADLT (ELECTROSURGICAL) ×3
ELECTRODE REM PT RTRN 9FT ADLT (ELECTROSURGICAL) ×1 IMPLANT
FILTER SMOKE EVAC LAPAROSHD (FILTER) ×2 IMPLANT
GAUZE VASELINE 3X9 (GAUZE/BANDAGES/DRESSINGS) ×2 IMPLANT
GLOVE BIO SURGEON STRL SZ 6.5 (GLOVE) ×2 IMPLANT
GLOVE BIO SURGEON STRL SZ7 (GLOVE) ×3 IMPLANT
GLOVE BIO SURGEONS STRL SZ 6.5 (GLOVE) ×1
GLOVE BIOGEL PI IND STRL 7.0 (GLOVE) ×4 IMPLANT
GLOVE BIOGEL PI INDICATOR 7.0 (GLOVE) ×8
IV STOPCOCK 4 WAY 40  W/Y SET (IV SOLUTION)
IV STOPCOCK 4 WAY 40 W/Y SET (IV SOLUTION) IMPLANT
KIT ACCESSORY DA VINCI DISP (KITS) ×2
KIT ACCESSORY DVNC DISP (KITS) ×1 IMPLANT
LEGGING LITHOTOMY PAIR STRL (DRAPES) ×3 IMPLANT
LIQUID BAND (GAUZE/BANDAGES/DRESSINGS) ×6 IMPLANT
OCCLUDER COLPOPNEUMO (BALLOONS) ×2 IMPLANT
PACK ROBOT WH (CUSTOM PROCEDURE TRAY) ×3 IMPLANT
PACK ROBOTIC GOWN (GOWN DISPOSABLE) ×3 IMPLANT
PAD PREP 24X48 CUFFED NSTRL (MISCELLANEOUS) ×6 IMPLANT
PAD TRENDELENBURG POSITION (MISCELLANEOUS) ×3 IMPLANT
SET CYSTO W/LG BORE CLAMP LF (SET/KITS/TRAYS/PACK) IMPLANT
SET IRRIG TUBING LAPAROSCOPIC (IRRIGATION / IRRIGATOR) ×4 IMPLANT
SET TRI-LUMEN FLTR TB AIRSEAL (TUBING) ×4 IMPLANT
SLEEVE XCEL OPT CAN 5 100 (ENDOMECHANICALS) ×3 IMPLANT
SUT VIC AB 0 CT1 27 (SUTURE) ×6
SUT VIC AB 0 CT1 27XBRD ANBCTR (SUTURE) ×2 IMPLANT
SUT VIC AB 4-0 PS2 27 (SUTURE) ×6 IMPLANT
SUT VICRYL 0 UR6 27IN ABS (SUTURE) ×4 IMPLANT
SUT VLOC 180 0 9IN  GS21 (SUTURE) ×2
SUT VLOC 180 0 9IN GS21 (SUTURE) IMPLANT
SYR 50ML LL SCALE MARK (SYRINGE) ×3 IMPLANT
SYSTEM CONVERTIBLE TROCAR (TROCAR) ×2 IMPLANT
TIP RUMI ORANGE 6.7MMX12CM (TIP) IMPLANT
TIP UTERINE 5.1X6CM LAV DISP (MISCELLANEOUS) IMPLANT
TIP UTERINE 6.7X10CM GRN DISP (MISCELLANEOUS) IMPLANT
TIP UTERINE 6.7X6CM WHT DISP (MISCELLANEOUS) IMPLANT
TIP UTERINE 6.7X8CM BLUE DISP (MISCELLANEOUS) ×4 IMPLANT
TOWEL OR 17X24 6PK STRL BLUE (TOWEL DISPOSABLE) ×9 IMPLANT
TROCAR 12M 150ML BLUNT (TROCAR) ×2 IMPLANT
TROCAR DISP BLADELESS 8 DVNC (TROCAR) ×1 IMPLANT
TROCAR DISP BLADELESS 8MM (TROCAR) ×2
TROCAR PORT AIRSEAL 5X120 (TROCAR) ×2 IMPLANT
TROCAR XCEL 12X100 BLDLESS (ENDOMECHANICALS) ×5 IMPLANT
TROCAR XCEL NON-BLD 5MMX100MML (ENDOMECHANICALS) ×3 IMPLANT
WATER STERILE IRR 1000ML POUR (IV SOLUTION) ×9 IMPLANT

## 2015-09-29 NOTE — Anesthesia Postprocedure Evaluation (Signed)
Anesthesia Post Note  [name]  Procedure(s) Performed: Procedure(s) (LRB): ROBOTIC ASSISTED TOTAL HYSTERECTOMY WITH SALPINGECTOMY (Bilateral)  Patient location during evaluation: PACU Anesthesia Type: General Level of consciousness: awake and alert, oriented and patient cooperative Pain management: pain level controlled Vital Signs Assessment: post-procedure vital signs reviewed and stable Respiratory status: spontaneous breathing, nonlabored ventilation, respiratory function stable and patient connected to nasal cannula oxygen Cardiovascular status: blood pressure returned to baseline and stable Postop Assessment: no signs of nausea or vomiting Anesthetic complications: no    Last Vitals:  Filed Vitals:   09/29/15 1245 09/29/15 1343  BP: 117/78 115/81  Pulse: 89 72  Temp: 36.8 C 36.8 C  Resp: 16 18    Last Pain:  Filed Vitals:   09/29/15 1432  PainSc: 7                  Shateria Paternostro,E. Daeshaun Specht

## 2015-09-29 NOTE — Addendum Note (Signed)
Addendum  created 09/29/15 1747 by Hewitt Blade, CRNA   Modules edited: Clinical Notes   Clinical Notes:  File: [id number]

## 2015-09-29 NOTE — Transfer of Care (Signed)
Immediate Anesthesia Transfer of Care Note  [name]  Procedure(s) Performed: Procedure(s): ROBOTIC ASSISTED TOTAL HYSTERECTOMY WITH SALPINGECTOMY (Bilateral)  Patient Location: PACU  Anesthesia Type:General  Level of Consciousness: awake, alert  and oriented  Airway & Oxygen Therapy: Patient Spontanous Breathing and Patient connected to nasal cannula oxygen  Post-op Assessment: Report given to RN, Post -op Vital signs reviewed and stable and Patient moving all extremities  Post vital signs: Reviewed and stable  Last Vitals:  Filed Vitals:   09/29/15 0558 09/29/15 0614  BP:  132/88  Pulse: 83   Temp: 36.8 C   Resp: 20     Complications: No apparent anesthesia complications

## 2015-09-29 NOTE — Op Note (Signed)
09/29/2015  11:03 AM  PATIENT:  Kristin Herrera  45 y.o. female  PRE-OPERATIVE DIAGNOSIS:  Menorrhagia, Fibroids, Anemia  POST-OPERATIVE DIAGNOSIS:  Menorrhagia, Fibroids, Anemia  PROCEDURE:  Procedure(s): ROBOTIC ASSISTED TOTAL HYSTERECTOMY WITH BILATERAL SALPINGECTOMY  SURGEON:  Surgeon(s): Princess Bruins, MD  ASSISTANTS: Hester Mates   ANESTHESIA:   general   PROCEDURE:  Under general anesthesia with endotracheal intubation the patient is an lithotomy position. She is prepped with Betadine on the suprapubic, vulvar and vaginal areas. She is prepped with ChloraPrep on the abdomen. She is draped as usual. The Foley is inserted in the bladder. A #8 Rumi with a medium Koh ring are put in place in the uterus without difficulty.  All other instruments are removed from the vagina.  Abdominally we infiltrated the subcutaneous tissue with Marcaine one quarter plain at the supraumbilical area. We make a 1.5 cm incision at that level with the scalpel. We grasped the aponeurosis with cokers. We make an incision in the aponeurosis with the Mayo scissors. We opened the parietal peritoneum bluntly with a finger. We applied a pursestring stitch of Vicryl 0 at the aponeurosis. The Sheryle Hail is inserted under direct vision and a pneumoperitoneum is created with CO2.  Inspection of the abdominal pelvic cavities reveal a normal liver, mild adhesions between the omentum and the upper right abdominal wall, the right ovary is also adherent to the lower right abdominal wall.  The uterus is increased in size secondary to myomas.  Both tubes are status post cauterization for tubal sterilization.  The size and appearance of the ovaries are normal.  No other lesion is present in the pelvis.          We use a semicircular configuration for port placement with 2 robotic ports on the right and 1 robotic port on the lower left and the assistant ports on the upper left.  All sites are marked with a pen, infiltrated with  Marcaine one quarter plain and a small incision is done with the scalpel.  All ports are inserted under direct vision.  After placing the patient in steep Trendelenburg, the robot is docked on the right side.  The robotic instruments are inserted under direct vision with the EndoShears scissor on the first arm, the PK in the second arm and the fenestrated clamp and the third arm. We go to the console.        Both ureters are visualized and normal anatomy position.  We started on the left side with cauterization and transection of the mesosalpinx.  We then cauterize and section the left round ligament.  We cauterize and section the left utero-ovarian ligament.  We then descend along the left side of the uterus opening the broad ligament.  We opened the anterior visceral peritoneum to the midline and started descending the bladder.  We proceeded exactly the same way on the right side and completed the descent of the bladder passed the cold ring.  We had to release adhesions between the bladder and the lower uterine segment given the previous C-section.  We then cauterized the left uterine artery.  We cauterized the backflow as well.  We cauterized the right uterine artery and backflow.  We sectioned the right uterine artery and then the left uterine artery.  Hemostasis was adequate at all levels.  The occluder was insufflated vaginally.  We open the upper aspects of the vagina on top of the KoRing anteriorly, posteriorly and finished with each side. The uterus with the cervix  and the tubes was completely detached and passed vaginally. The specimen was sent to pathology. We put the occluder back in place vaginally.  The instruments were switched to the cutting needle driver in the first arm, the long tip in the second arm and the PK in the third arm.  We used a V lock 0 at 9 inches to close the vaginal vault.  We started at the right angle and did a running suture all the way to the left angle and then back to the  midline.  Hemostasis was good.  We parked the needle at the left abdominal sidewall.  We irrigated and suctioned the abdominal pelvic cavities.  Hemostasis was confirmed once more at all levels.  We removed all robotic instruments under direct vision.  The robot was undocked.  We went to laparoscopy time.      We used an 8 mm camera in a robotic port to remove the needle from the [place] port.  We removed the patient from steep to the [place] and completed irrigation and suction.  Hemostasis was adequate.  The poured 80 cc of bupivacaine in the abdominopelvic cavity.  We then removed all laparoscopy instruments.  The ports were removed under direct vision.  The CO2 was evacuated.      We closed the supraumbilical incision by attaching a pursestring stitch at the aponeurosis. We then closed all incisions with a subcuticular suture of Vicryl 4-0. Dermabond was added on all incisions.  Hemostasis was good at all incisions.  The occluder had been removed from the vagina.  Hemostasis was adequate at the vagina as well.  The patient was brought to recovery room in good and stable status.  ESTIMATED BLOOD LOSS: 100 cc   Intake/Output Summary (Last 24 hours) at 09/29/15 1103 Last data filed at 09/29/15 1036  Gross per 24 hour  Intake   1400 ml  Output    350 ml  Net   1050 ml     BLOOD ADMINISTERED:none   LOCAL MEDICATIONS USED:  MARCAINE    and BUPIVICAINE   SPECIMEN:  Source of Specimen:  Uterus with cervix and bilateral tubes  DISPOSITION OF SPECIMEN:  PATHOLOGY  COUNTS:  YES  PLAN OF CARE: Transfer to PACU  Princess Bruins MD  09/29/2015 at 11:03 am

## 2015-09-29 NOTE — Anesthesia Procedure Notes (Signed)
Procedure Name: Intubation Date/Time: 09/29/2015 7:42 AM Performed by: Hewitt Blade Pre-anesthesia Checklist: Patient identified, Emergency Drugs available, Suction available and Patient being monitored Patient Re-evaluated:Patient Re-evaluated prior to inductionOxygen Delivery Method: Circle system utilized Preoxygenation: Pre-oxygenation with 100% oxygen Intubation Type: IV induction Ventilation: Mask ventilation without difficulty Laryngoscope Size: Mac and 3 Grade View: Grade I Tube type: Oral Tube size: 7.0 mm Number of attempts: 1 Airway Equipment and Method: Stylet Placement Confirmation: ETT inserted through vocal cords under direct vision,  positive ETCO2 and breath sounds checked- equal and bilateral Secured at: 20 cm Tube secured with: Tape Dental Injury: Teeth and Oropharynx as per pre-operative assessment

## 2015-09-29 NOTE — H&P (Signed)
Kristin Herrera is an 45 y.o. female G15P2A3  RP:  Sxic Uterine myomas with Menorrhagia for Robotic TLH/Bilateral Salpingectomy  HPI:  Severe menorrhagia with anemia.  Uterine myomas.  IUD attempted, but not controlling the problem and fell off.     Pertinent Gynecological History: Menses: flow is excessive with use of many pads or tampons on heaviest days Contraception: BT/S Blood transfusions: none Sexually transmitted diseases: no past history Last mammogram: normal  Last pap: normal     Menstrual History: No LMP recorded.    Past Medical History  Diagnosis Date  . Hypertension   . Frequent episodic tension-type headache   . Diverticulitis   . Gestational diabetes     Past Surgical History  Procedure Laterality Date  . Cesarean section  1991, 1999  . Tubal ligation  1999    Family History  Problem Relation Age of Onset  . Hypertension Mother   . Heart disease Father   . Heart disease Paternal Uncle   . Hypertension Maternal Grandmother   . Diabetes Maternal Grandmother   . Stroke Paternal Grandmother   . Hypertension Paternal Grandmother   . Diabetes Paternal Grandmother   . Heart disease Paternal Grandfather     Social History:  reports that she quit smoking about 5 years ago. Her smoking use included Cigarettes. She quit after 10 years of use. She has never used smokeless tobacco. She reports that she does not drink alcohol or use illicit drugs.  Allergies:  Allergies  Allergen Reactions  . Simvastatin Other (See Comments)    cramping    Prescriptions prior to admission  Medication Sig Dispense Refill Last Dose  . ibuprofen (ADVIL,MOTRIN) 200 MG tablet Take 600 mg by mouth every 6 (six) hours as needed for mild pain.   Past Week at Unknown time  . Iron TABS Take 1 tablet by mouth daily.   Past Month at Unknown time  . valsartan-hydrochlorothiazide (DIOVAN-HCT) 80-12.5 MG tablet Take 1 tablet by mouth daily. 30 tablet 5 09/28/2015 at 0800    ROS  neg  Blood pressure 132/88, pulse 83, temperature 98.2 F (36.8 C), temperature source Oral, resp. rate 20, SpO2 100 %. Physical Exam   Pelvic US 2016:  Overall uterine volume 160 cc.  Uterine myomas, largest 5+ cm.  Ovaries wnl x2.  Results for orders placed or performed during the hospital encounter of 09/29/15 (from the past 24 hour(s))  Pregnancy, urine     Status: None   Collection Time: 09/29/15  6:00 AM  Result Value Ref Range   Preg Test, Ur NEGATIVE NEGATIVE    Assessment/Plan: Severe refractory menorrhagia associated with uterine myomas.  Secondary anemia.  For Robotic TLH/Bilateral Salpingectomy.  Surgery and risks thoroughly reviewed.  Carlei Huang,MARIE-LYNE 09/29/2015, 6:55 AM

## 2015-09-29 NOTE — Anesthesia Postprocedure Evaluation (Signed)
Anesthesia Post Note  [name]  Procedure(s) Performed: Procedure(s) (LRB): ROBOTIC ASSISTED TOTAL HYSTERECTOMY WITH SALPINGECTOMY (Bilateral)  Patient location during evaluation: [facility] Anesthesia Type: General Pain management: pain level controlled Vital Signs Assessment: post-procedure vital signs reviewed and stable Respiratory status: spontaneous breathing Cardiovascular status: stable Postop Assessment: no signs of nausea or vomiting and adequate PO intake Anesthetic complications: no    Last Vitals:  Filed Vitals:   09/29/15 1245 09/29/15 1343  BP: 117/78 115/81  Pulse: 89 72  Temp: 36.8 C 36.8 C  Resp: 16 18    Last Pain:  Filed Vitals:   09/29/15 1716  PainSc: [place]

## 2015-09-30 ENCOUNTER — Encounter (HOSPITAL_COMMUNITY): Payer: Self-pay | Admitting: Obstetrics & Gynecology

## 2015-09-30 DIAGNOSIS — N920 Excessive and frequent menstruation with regular cycle: Secondary | ICD-10-CM | POA: Diagnosis not present

## 2015-09-30 LAB — CBC
HEMATOCRIT: 28.2 % — AB (ref 36.0–46.0)
Hemoglobin: 8.8 g/dL — ABNORMAL LOW (ref 12.0–15.0)
MCH: 24.4 pg — ABNORMAL LOW (ref 26.0–34.0)
MCHC: 31.2 g/dL (ref 30.0–36.0)
MCV: 78.1 fL (ref 78.0–100.0)
Platelets: 286 10*3/uL (ref 150–400)
RBC: 3.61 MIL/uL — AB (ref 3.87–5.11)
RDW: 16.1 % — AB (ref 11.5–15.5)
WBC: 14.4 10*3/uL — AB (ref 4.0–10.5)

## 2015-09-30 MED ORDER — OXYCODONE-ACETAMINOPHEN 7.5-325 MG PO TABS: 1.0000 | ORAL_TABLET | ORAL | Status: DC | PRN

## 2015-09-30 NOTE — Discharge Instructions (Signed)
Total Laparoscopic Hysterectomy, Care After °Refer to this sheet in the next few weeks. These instructions provide you with information on caring for yourself after your procedure. Your health care provider may also give you more specific instructions. Your treatment has been planned according to current medical practices, but problems sometimes occur. Call your health care provider if you have any problems or questions after your procedure. °WHAT TO EXPECT AFTER THE PROCEDURE °· Pain and bruising at the incision sites. You will be given pain medicine to control it. °· Menopausal symptoms such as hot flashes, night sweats, and insomnia if your ovaries were removed. °· Sore throat from the breathing tube that was inserted during surgery. °HOME CARE INSTRUCTIONS °· Only take over-the-counter or prescription medicines for pain, discomfort, or fever as directed by your health care provider.   °· Do not take aspirin. It can cause bleeding.   °· Do not drive when taking pain medicine.   °· Follow your health care provider's advice regarding diet, exercise, lifting, driving, and general activities.   °· Resume your usual diet as directed and allowed.   °· Get plenty of rest and sleep.   °· Do not douche, use tampons, or have sexual intercourse for at least 6 weeks, or until your health care provider gives you permission.   °· Change your bandages (dressings) as directed by your health care provider.   °· Monitor your temperature and notify your health care provider of a fever.   °· Take showers instead of baths for 2-3 weeks.   °· Do not drink alcohol until your health care provider gives you permission.   °· If you develop constipation, you may take a mild laxative with your health care provider's permission. Bran foods may help with constipation problems. Drinking enough fluids to keep your urine clear or pale yellow may help as well.   °· Try to have someone home with you for 1-2 weeks to help around the house.    °· Keep all of your follow-up appointments as directed by your health care provider.   °SEEK MEDICAL CARE IF: °· You have swelling, redness, or increasing pain around your incision sites.   °· You have pus coming from your incision.   °· You notice a bad smell coming from your incision.   °· Your incision breaks open.   °· You feel dizzy or lightheaded.   °· You have pain or bleeding when you urinate.   °· You have persistent diarrhea.   °· You have persistent nausea and vomiting.   °· You have abnormal vaginal discharge.   °· You have a rash.   °· You have any type of abnormal reaction or develop an allergy to your medicine.   °· You have poor pain control with your prescribed medicine.   °SEEK IMMEDIATE MEDICAL CARE IF: °· You have chest pain or shortness of breath. °· You have severe abdominal pain that is not relieved with pain medicine. °· You have pain or swelling in your legs. °MAKE SURE YOU: °· Understand these instructions. °· Will watch your condition. °· Will get help right away if you are not doing well or get worse. °  °This information is not intended to replace advice given to you by your health care provider. Make sure you discuss any questions you have with your health care provider. °  °Document Released: 06/03/2013 Document Revised: 08/18/2013 Document Reviewed: 06/03/2013 °Elsevier Interactive Patient Education ©2016 [employer] ° °

## 2015-09-30 NOTE — Progress Notes (Signed)
Pt discharged to home with significant other.  Condition stable.  Discharge instructions reviewed with pt and significant other together. Pt ambulated to car with RN.  No equipment for home ordered at discharge.

## 2015-09-30 NOTE — Progress Notes (Signed)
1 Day Post-Op Procedure(s) (LRB): ROBOTIC ASSISTED TOTAL HYSTERECTOMY WITH SALPINGECTOMY (Bilateral)  Subjective: Patient reports that pain is well managed.  Tolerating normal diet as tolerated  diet without difficulty. No nausea / vomiting.  Ambulating and voiding.  Objective: BP 104/70 mmHg  Pulse 75  Temp(Src) 98.4 F (36.9 C) (Oral)  Resp 16  Ht 5\' 3"  (1.6 m)  Wt 198 lb (89.812 kg)  BMI 35.08 kg/m2  SpO2 98% Lungs: clear Heart: normal rate and rhythm Abdomen:soft and appropriately tender Extremities: Homans sign is negative, no sign of DVT Incision: healing well  Results for APRIL, GAMBA (MRN [id number]) as of 09/30/2015 08:12  Ref. Range 09/30/2015 05:25  WBC Latest Ref Range: 4.0-10.5 K/uL 14.4 (H)  RBC Latest Ref Range: 3.87-5.11 MIL/uL 3.61 (L)  Hemoglobin Latest Ref Range: 12.0-15.0 g/dL 8.8 (L)  HCT Latest Ref Range: 36.0-46.0 % 28.2 (L)  MCV Latest Ref Range: 78.0-100.0 fL 78.1  MCH Latest Ref Range: 26.0-34.0 pg 24.4 (L)  MCHC Latest Ref Range: 30.0-36.0 g/dL 31.2  RDW Latest Ref Range: 11.5-15.5 % 16.1 (H)  Platelets Latest Ref Range: 150-400 K/uL 286    Assessment: s/p Procedure(s): ROBOTIC ASSISTED TOTAL HYSTERECTOMY WITH SALPINGECTOMY: progressing well  Anemia  Plan: Discharge home FeSO4 325 BID to TID.   Adelma Bowdoin,MARIE-LYNE 09/30/2015, 8:12 AM

## 2015-09-30 NOTE — Discharge Summary (Signed)
  Physician Discharge Summary  Patient ID: Kristin Herrera MRN: [id number] DOB/AGE: June 14, 1971 45 y.o.  Admit date: 09/29/2015 Discharge date: 09/30/2015  Admission Diagnoses: Menorrhagia, Fibroids, Anemia  Discharge Diagnoses: Menorrhagia, Fibroids, Anemia        Active Problems:   Post-operative state   Discharged Condition: good  Hospital Course: good  Consults: None  Treatments: surgery: Robotic Total Laparoscopic Hysterectomy with Bilateral Salpingectomy  Disposition: 01-Home or Self Care     Medication List    TAKE these medications        ibuprofen 200 MG tablet  Commonly known as:  ADVIL,MOTRIN  Take 600 mg by mouth every 6 (six) hours as needed for mild pain.     Iron Tabs  Take 1 tablet by mouth daily.     oxyCODONE-acetaminophen 7.5-325 MG tablet  Commonly known as:  PERCOCET  Take 1 tablet by mouth every 4 (four) hours as needed for severe pain.     valsartan-hydrochlorothiazide 80-12.5 MG tablet  Commonly known as:  DIOVAN-HCT  Take 1 tablet by mouth daily.           Follow-up Information    Follow up with Dona Klemann,MARIE-LYNE, MD In 3 weeks.   Specialty:  Obstetrics and Gynecology   Contact information:   [address] [place] [address] [phone]       Signed: Princess Bruins, MD 09/30/2015, 8:24 AM

## 2015-10-08 ENCOUNTER — Encounter (HOSPITAL_BASED_OUTPATIENT_CLINIC_OR_DEPARTMENT_OTHER): Payer: Self-pay | Admitting: Emergency Medicine

## 2015-10-08 ENCOUNTER — Emergency Department (HOSPITAL_BASED_OUTPATIENT_CLINIC_OR_DEPARTMENT_OTHER): Payer: PRIVATE HEALTH INSURANCE

## 2015-10-08 ENCOUNTER — Inpatient Hospital Stay (HOSPITAL_BASED_OUTPATIENT_CLINIC_OR_DEPARTMENT_OTHER)
Admission: EM | Admit: 2015-10-08 | Discharge: 2015-10-12 | DRG: 689 | Disposition: A | Discharge status: Home / Self Care | Payer: PRIVATE HEALTH INSURANCE | Attending: Obstetrics & Gynecology | Admitting: Obstetrics & Gynecology

## 2015-10-08 DIAGNOSIS — Z87891 Personal history of nicotine dependence: Secondary | ICD-10-CM

## 2015-10-08 DIAGNOSIS — N12 Tubulo-interstitial nephritis, not specified as acute or chronic: Secondary | ICD-10-CM

## 2015-10-08 DIAGNOSIS — T814XXA Infection following a procedure, initial encounter: Secondary | ICD-10-CM | POA: Diagnosis present

## 2015-10-08 DIAGNOSIS — A419 Sepsis, unspecified organism: Secondary | ICD-10-CM | POA: Diagnosis present

## 2015-10-08 DIAGNOSIS — G8918 Other acute postprocedural pain: Secondary | ICD-10-CM

## 2015-10-08 DIAGNOSIS — R6510 Systemic inflammatory response syndrome (SIRS) of non-infectious origin without acute organ dysfunction: Secondary | ICD-10-CM

## 2015-10-08 DIAGNOSIS — R109 Unspecified abdominal pain: Secondary | ICD-10-CM

## 2015-10-08 DIAGNOSIS — R5082 Postprocedural fever: Secondary | ICD-10-CM

## 2015-10-08 LAB — URINALYSIS, ROUTINE W REFLEX MICROSCOPIC
BILIRUBIN URINE: NEGATIVE
GLUCOSE, UA: NEGATIVE mg/dL
Hgb urine dipstick: NEGATIVE
KETONES UR: NEGATIVE mg/dL
Nitrite: NEGATIVE
PROTEIN: NEGATIVE mg/dL
Specific Gravity, Urine: 1.021 (ref 1.005–1.030)
pH: 6 (ref 5.0–8.0)

## 2015-10-08 LAB — CBC WITH DIFFERENTIAL/PLATELET
BASOS ABS: 0 10*3/uL (ref 0.0–0.1)
BASOS PCT: 0 %
Eosinophils Absolute: 0.1 10*3/uL (ref 0.0–0.7)
Eosinophils Relative: 1 %
HEMATOCRIT: 33.6 % — AB (ref 36.0–46.0)
HEMOGLOBIN: 10.3 g/dL — AB (ref 12.0–15.0)
LYMPHS PCT: 8 %
Lymphs Abs: 1.4 10*3/uL (ref 0.7–4.0)
MCH: 23.7 pg — ABNORMAL LOW (ref 26.0–34.0)
MCHC: 30.7 g/dL (ref 30.0–36.0)
MCV: 77.4 fL — AB (ref 78.0–100.0)
MONO ABS: 1.3 10*3/uL — AB (ref 0.1–1.0)
MONOS PCT: 7 %
NEUTROS ABS: 16.2 10*3/uL — AB (ref 1.7–7.7)
NEUTROS PCT: 84 %
Platelets: 404 10*3/uL — ABNORMAL HIGH (ref 150–400)
RBC: 4.34 MIL/uL (ref 3.87–5.11)
RDW: 15.4 % (ref 11.5–15.5)
WBC: 19 10*3/uL — ABNORMAL HIGH (ref 4.0–10.5)

## 2015-10-08 LAB — URINE MICROSCOPIC-ADD ON

## 2015-10-08 MED ORDER — VANCOMYCIN HCL IN DEXTROSE 1-5 GM/200ML-% IV SOLN
1000.0000 mg | Freq: Once | INTRAVENOUS | Status: AC
  Administered 2015-10-09: 1000 mg via INTRAVENOUS
  Filled 2015-10-08: qty 200

## 2015-10-08 MED ORDER — ACETAMINOPHEN 325 MG PO TABS
650.0000 mg | ORAL_TABLET | Freq: Once | ORAL | Status: AC | PRN
  Administered 2015-10-08: 650 mg via ORAL
  Filled 2015-10-08: qty 2

## 2015-10-08 MED ORDER — ONDANSETRON 4 MG PO TBDP
4.0000 mg | ORAL_TABLET | Freq: Once | ORAL | Status: AC | PRN
  Administered 2015-10-08: 4 mg via ORAL
  Filled 2015-10-08: qty 1

## 2015-10-08 MED ORDER — SODIUM CHLORIDE 0.9 % IV BOLUS (SEPSIS)
2000.0000 mL | Freq: Once | INTRAVENOUS | Status: AC
  Administered 2015-10-08: 2000 mL via INTRAVENOUS

## 2015-10-08 NOTE — ED Provider Notes (Signed)
CSN: [id number]     Arrival date & time 10/08/15  2147 History  By signing my name below, I, Kristin Herrera, attest that this documentation has been prepared under the direction and in the presence of Vinny Taranto, MD. Electronically Signed: Helane Herrera, ED Scribe. 10/08/2015. 11:15 PM.    Chief Complaint  Patient presents with  . Nausea   Patient is a 45 y.o. female presenting with fever. The history is provided by the patient. No language interpreter was used.  Fever Temp source:  Subjective Severity:  Moderate Onset quality:  Gradual Duration:  1 day Timing:  Constant Progression:  Unchanged Chronicity:  New Relieved by:  Nothing Worsened by:  Nothing tried Associated symptoms: diarrhea, dysuria and nausea   Associated symptoms: no confusion, no congestion, no cough and no vomiting   Risk factors: recent surgery    HPI Comments: Kristin Herrera is a 45 y.o. female who presents to the Emergency Department complaining of nausea onset this morning. She reports associated burning dysuria onset yesterday, subjective fever onset today, mild diarrhea, and pelvic pain.  She states she had a TAH surgery on 2/2 and has not yet contacted her surgeon about these symptoms. Pt denies vomiting, cough, and congestion.   Past Medical History  Diagnosis Date  . Hypertension   . Frequent episodic tension-type headache   . Diverticulitis   . Gestational diabetes    Past Surgical History  Procedure Laterality Date  . Cesarean section  1991, 1999  . Tubal ligation  1999  . Robotic assisted total hysterectomy with salpingectomy Bilateral 09/29/2015    Procedure: ROBOTIC ASSISTED TOTAL HYSTERECTOMY WITH SALPINGECTOMY;  Surgeon: Princess Bruins, MD;  Location: [facility];  Service: Gynecology;  Laterality: Bilateral;   Family History  Problem Relation Age of Onset  . Hypertension Mother   . Heart disease Father   . Heart disease Paternal Uncle   . Hypertension Maternal Grandmother   .  Diabetes Maternal Grandmother   . Stroke Paternal Grandmother   . Hypertension Paternal Grandmother   . Diabetes Paternal Grandmother   . Heart disease Paternal Grandfather    Social History  Substance Use Topics  . Smoking status: Former Smoker -- 10 years    Types: Cigarettes    Quit date: 03/10/2010  . Smokeless tobacco: Never Used  . Alcohol Use: No   OB History    No data available     Review of Systems  Constitutional: Positive for fever.  HENT: Negative for congestion.   Respiratory: Negative for cough.   Gastrointestinal: Positive for nausea and diarrhea. Negative for vomiting.  Genitourinary: Positive for dysuria, frequency and pelvic pain.  Psychiatric/Behavioral: Negative for confusion.  All other systems reviewed and are negative.   Allergies  Simvastatin  Home Medications   Prior to Admission medications   Medication Sig Start Date End Date Taking? Authorizing Provider  ibuprofen (ADVIL,MOTRIN) 200 MG tablet Take 600 mg by mouth every 6 (six) hours as needed for mild pain.   Yes Historical Provider, MD  oxyCODONE-acetaminophen (PERCOCET) 7.5-325 MG tablet Take 1 tablet by mouth every 4 (four) hours as needed for severe pain. 09/30/15  Yes Princess Bruins, MD  Iron TABS Take 1 tablet by mouth daily.    Historical Provider, MD  valsartan-hydrochlorothiazide (DIOVAN-HCT) 80-12.5 MG tablet Take 1 tablet by mouth daily. 09/14/15   Debbrah Alar, NP   BP 136/97 mmHg  Pulse 108  Temp(Src) 101.7 F (38.7 C) (Oral)  Resp 18  Wt 198 lb (  89.812 kg)  SpO2 98%  LMP 09/08/2015 Physical Exam  Constitutional: She is oriented to person, place, and time. She appears well-developed and well-nourished.  HENT:  Head: Normocephalic and atraumatic.  Mouth/Throat: Oropharynx is clear and moist.  Eyes: Conjunctivae are normal. Pupils are equal, round, and reactive to light. Right eye exhibits no discharge. Left eye exhibits no discharge.  Neck: Normal range of motion.  Neck supple.  Cardiovascular: Regular rhythm and intact distal pulses.  Exam reveals no gallop and no friction rub.   No murmur heard. Tachycardia but regular  Pulmonary/Chest: Effort normal and breath sounds normal. No respiratory distress. She has no wheezes. She has no rales.  Abdominal: Soft. She exhibits no mass. There is no tenderness. There is no rebound and no guarding.  Hyperactive bowel sounds  Genitourinary:  Trochar sites are clean and dry, glue is coming off  Musculoskeletal: Normal range of motion.  Neurological: She is alert and oriented to person, place, and time. Coordination normal.  Skin: Skin is warm and dry. No rash noted. She is not diaphoretic. No erythema.  Psychiatric: She has a normal mood and affect.  Nursing note and vitals reviewed.   ED Course  Procedures  DIAGNOSTIC STUDIES: Oxygen Saturation is 100% on RA, normal by my interpretation.    COORDINATION OF CARE: 11:13 PM - Discussed plans to admit. Pt advised of plan for treatment and pt agrees.  Labs Review Labs Reviewed  URINALYSIS, ROUTINE W REFLEX MICROSCOPIC (NOT AT [facility]) - Abnormal; Notable for the following:    Leukocytes, UA SMALL (*)    All other components within normal limits  URINE MICROSCOPIC-ADD ON - Abnormal; Notable for the following:    Squamous Epithelial / LPF 0-5 (*)    Bacteria, UA MANY (*)    All other components within normal limits  CULTURE, BLOOD (ROUTINE X 2)  CULTURE, BLOOD (ROUTINE X 2)  URINE CULTURE  CBC WITH DIFFERENTIAL/PLATELET  COMPREHENSIVE METABOLIC PANEL  I-STAT CG4 LACTIC ACID, ED    Imaging Review No results found. I have personally reviewed and evaluated these images and lab results as part of my medical decision-making.   EKG Interpretation None      MDM   Final diagnoses:  None    Results for orders placed or performed during the hospital encounter of 10/08/15  Urinalysis, Routine w reflex microscopic (not at [facility])  Result Value Ref Range    Color, Urine YELLOW YELLOW   APPearance CLEAR CLEAR   Specific Gravity, Urine 1.021 1.005 - 1.030   pH 6.0 5.0 - 8.0   Glucose, UA NEGATIVE NEGATIVE mg/dL   Hgb urine dipstick NEGATIVE NEGATIVE   Bilirubin Urine NEGATIVE NEGATIVE   Ketones, ur NEGATIVE NEGATIVE mg/dL   Protein, ur NEGATIVE NEGATIVE mg/dL   Nitrite NEGATIVE NEGATIVE   Leukocytes, UA SMALL (A) NEGATIVE  Urine microscopic-add on  Result Value Ref Range   Squamous Epithelial / LPF 0-5 (A) NONE SEEN   WBC, UA 6-30 0 - 5 WBC/hpf   RBC / HPF 0-5 0 - 5 RBC/hpf   Bacteria, UA MANY (A) NONE SEEN   Urine-Other MUCOUS PRESENT   CBC with Differential/Platelet  Result Value Ref Range   WBC 19.0 (H) 4.0 - 10.5 K/uL   RBC 4.34 3.87 - 5.11 MIL/uL   Hemoglobin 10.3 (L) 12.0 - 15.0 g/dL   HCT 33.6 (L) 36.0 - 46.0 %   MCV 77.4 (L) 78.0 - 100.0 fL   MCH 23.7 (L) 26.0 - 34.0  pg   MCHC 30.7 30.0 - 36.0 g/dL   RDW 15.4 11.5 - 15.5 %   Platelets 404 (H) 150 - 400 K/uL   Neutrophils Relative % 84 %   Neutro Abs 16.2 (H) 1.7 - 7.7 K/uL   Lymphocytes Relative 8 %   Lymphs Abs 1.4 0.7 - 4.0 K/uL   Monocytes Relative 7 %   Monocytes Absolute 1.3 (H) 0.1 - 1.0 K/uL   Eosinophils Relative 1 %   Eosinophils Absolute 0.1 0.0 - 0.7 K/uL   Basophils Relative 0 %   Basophils Absolute 0.0 0.0 - 0.1 K/uL  Comprehensive metabolic panel  Result Value Ref Range   Sodium 138 135 - 145 mmol/L   Potassium 4.1 3.5 - 5.1 mmol/L   Chloride 104 101 - 111 mmol/L   CO2 23 22 - 32 mmol/L   Glucose, Bld 114 (H) 65 - 99 mg/dL   BUN 12 6 - 20 mg/dL   Creatinine, Ser 0.88 0.44 - 1.00 mg/dL   Calcium 9.0 8.9 - 10.3 mg/dL   Total Protein 7.9 6.5 - 8.1 g/dL   Albumin 3.6 3.5 - 5.0 g/dL   AST 15 15 - 41 U/L   ALT 11 (L) 14 - 54 U/L   Alkaline Phosphatase 74 38 - 126 U/L   Total Bilirubin 0.5 0.3 - 1.2 mg/dL   GFR calc non Af Amer >60 >60 mL/min   GFR calc Af Amer >60 >60 mL/min   Anion gap 11 5 - 15  I-Stat CG4 Lactic Acid, ED  Result Value Ref  Range   Lactic Acid, Venous 1.16 0.5 - 2.0 mmol/L   Dg Chest 2 View  10/08/2015  CLINICAL DATA:  45 year old female with nausea.  Postoperative. EXAM: CHEST  2 VIEW COMPARISON:  Radiograph dated 04/13/2013 FINDINGS: The heart size and mediastinal contours are within normal limits. Both lungs are clear. The visualized skeletal structures are unremarkable. IMPRESSION: No active cardiopulmonary disease. Electronically Signed   By: Anner Crete M.D.   On: 10/08/2015 23:35    Medications  cefTRIAXone (ROCEPHIN) 1 g in dextrose 5 % 50 mL IVPB (not administered)  ibuprofen (ADVIL,MOTRIN) tablet 800 mg (not administered)  ondansetron (ZOFRAN-ODT) disintegrating tablet 4 mg (4 mg Oral Given 10/08/15 2200)  acetaminophen (TYLENOL) tablet 650 mg (650 mg Oral Given 10/08/15 2200)  sodium chloride 0.9 % bolus 2,000 mL (0 mLs Intravenous Stopped 10/09/15 0018)  vancomycin (VANCOCIN) IVPB 1000 mg/200 mL premix (1,000 mg Intravenous Rate/Dose Change 10/09/15 0103)   Case d/w Dr. Dellis Filbert who [facility]  I personally performed the services described in this documentation, which was scribed in my presence. The recorded information has been reviewed and is accurate.     Veatrice Kells, MD 10/09/15 [phone]

## 2015-10-08 NOTE — ED Notes (Addendum)
Patient states that she started to have Nausea today. Had a TAH on the 2nd. Reports that she is having lower pelvic pain and burning with urination with fever.

## 2015-10-09 ENCOUNTER — Inpatient Hospital Stay (HOSPITAL_COMMUNITY): Payer: PRIVATE HEALTH INSURANCE

## 2015-10-09 ENCOUNTER — Encounter (HOSPITAL_BASED_OUTPATIENT_CLINIC_OR_DEPARTMENT_OTHER): Payer: Self-pay | Admitting: Emergency Medicine

## 2015-10-09 DIAGNOSIS — T814XXA Infection following a procedure, initial encounter: Secondary | ICD-10-CM | POA: Diagnosis present

## 2015-10-09 DIAGNOSIS — Z87891 Personal history of nicotine dependence: Secondary | ICD-10-CM | POA: Diagnosis not present

## 2015-10-09 DIAGNOSIS — A419 Sepsis, unspecified organism: Secondary | ICD-10-CM | POA: Diagnosis present

## 2015-10-09 DIAGNOSIS — R6510 Systemic inflammatory response syndrome (SIRS) of non-infectious origin without acute organ dysfunction: Secondary | ICD-10-CM | POA: Diagnosis present

## 2015-10-09 DIAGNOSIS — N12 Tubulo-interstitial nephritis, not specified as acute or chronic: Secondary | ICD-10-CM | POA: Diagnosis present

## 2015-10-09 LAB — COMPREHENSIVE METABOLIC PANEL
ALBUMIN: 3.6 g/dL (ref 3.5–5.0)
ALT: 11 U/L — ABNORMAL LOW (ref 14–54)
ANION GAP: 11 (ref 5–15)
AST: 15 U/L (ref 15–41)
Alkaline Phosphatase: 74 U/L (ref 38–126)
BILIRUBIN TOTAL: 0.5 mg/dL (ref 0.3–1.2)
BUN: 12 mg/dL (ref 6–20)
CO2: 23 mmol/L (ref 22–32)
Calcium: 9 mg/dL (ref 8.9–10.3)
Chloride: 104 mmol/L (ref 101–111)
Creatinine, Ser: 0.88 mg/dL (ref 0.44–1.00)
GFR calc Af Amer: >60 mL/min (ref >60)
GFR calc non Af Amer: >60 mL/min (ref >60)
GLUCOSE: 114 mg/dL — AB (ref 65–99)
POTASSIUM: 4.1 mmol/L (ref 3.5–5.1)
SODIUM: 138 mmol/L (ref 135–145)
TOTAL PROTEIN: 7.9 g/dL (ref 6.5–8.1)

## 2015-10-09 LAB — I-STAT CG4 LACTIC ACID, ED: Lactic Acid, Venous: 1.16 mmol/L (ref 0.5–2.0)

## 2015-10-09 MED ORDER — HYDROCHLOROTHIAZIDE 12.5 MG PO CAPS
12.5000 mg | ORAL_CAPSULE | Freq: Every day | ORAL | Status: DC
  Administered 2015-10-11 – 2015-10-12 (×2): 12.5 mg via ORAL
  Filled 2015-10-09 (×4): qty 1

## 2015-10-09 MED ORDER — ACETAMINOPHEN 650 MG RE SUPP
650.0000 mg | Freq: Once | RECTAL | Status: AC
  Administered 2015-10-09: 650 mg via RECTAL
  Filled 2015-10-09: qty 1

## 2015-10-09 MED ORDER — IBUPROFEN 100 MG/5ML PO SUSP
600.0000 mg | Freq: Four times a day (QID) | ORAL | Status: DC | PRN
  Filled 2015-10-09: qty 30

## 2015-10-09 MED ORDER — IBUPROFEN 800 MG PO TABS
800.0000 mg | ORAL_TABLET | Freq: Once | ORAL | Status: AC
  Administered 2015-10-09: 800 mg via ORAL
  Filled 2015-10-09: qty 1

## 2015-10-09 MED ORDER — VANCOMYCIN HCL IN DEXTROSE 1-5 GM/200ML-% IV SOLN
1000.0000 mg | Freq: Three times a day (TID) | INTRAVENOUS | Status: DC
  Administered 2015-10-09 – 2015-10-10 (×4): 1000 mg via INTRAVENOUS
  Filled 2015-10-09 (×6): qty 200

## 2015-10-09 MED ORDER — PIPERACILLIN-TAZOBACTAM 3.375 G IVPB
3.3750 g | Freq: Three times a day (TID) | INTRAVENOUS | Status: DC
  Administered 2015-10-09 (×2): 3.375 g via INTRAVENOUS
  Filled 2015-10-09 (×3): qty 50

## 2015-10-09 MED ORDER — LACTATED RINGERS IV SOLN
INTRAVENOUS | Status: DC
  Administered 2015-10-09: 05:00:00 via INTRAVENOUS

## 2015-10-09 MED ORDER — DEXTROSE 5 % IV SOLN
1.0000 g | Freq: Once | INTRAVENOUS | Status: AC
  Administered 2015-10-09: 1 g via INTRAVENOUS
  Filled 2015-10-09: qty 10

## 2015-10-09 MED ORDER — IBUPROFEN 600 MG PO TABS
600.0000 mg | ORAL_TABLET | Freq: Four times a day (QID) | ORAL | Status: DC | PRN
  Administered 2015-10-09 – 2015-10-12 (×5): 600 mg via ORAL
  Filled 2015-10-09 (×4): qty 1

## 2015-10-09 MED ORDER — DEXTROSE 5 % IV SOLN
2.0000 g | INTRAVENOUS | Status: DC
  Administered 2015-10-09 – 2015-10-12 (×4): 2 g via INTRAVENOUS
  Filled 2015-10-09 (×5): qty 2

## 2015-10-09 MED ORDER — SODIUM CHLORIDE 0.9 % IV SOLN
INTRAVENOUS | Status: DC
  Administered 2015-10-09 – 2015-10-12 (×6): via INTRAVENOUS
  Administered 2015-10-12: 125 mL/h via INTRAVENOUS

## 2015-10-09 MED ORDER — OXYCODONE-ACETAMINOPHEN 5-325 MG PO TABS
1.0000 | ORAL_TABLET | Freq: Four times a day (QID) | ORAL | Status: DC | PRN
  Administered 2015-10-09: 1 via ORAL
  Administered 2015-10-09: 2 via ORAL
  Administered 2015-10-10: 1 via ORAL
  Filled 2015-10-09: qty 2
  Filled 2015-10-09 (×2): qty 1

## 2015-10-09 MED ORDER — IOHEXOL 300 MG/ML  SOLN
100.0000 mL | Freq: Once | INTRAMUSCULAR | Status: AC | PRN
  Administered 2015-10-09: 100 mL via INTRAVENOUS

## 2015-10-09 MED ORDER — SODIUM CHLORIDE 0.9 % IV BOLUS (SEPSIS)
1000.0000 mL | Freq: Once | INTRAVENOUS | Status: AC
  Administered 2015-10-09: 1000 mL via INTRAVENOUS

## 2015-10-09 MED ORDER — ACETAMINOPHEN 325 MG PO TABS
650.0000 mg | ORAL_TABLET | Freq: Once | ORAL | Status: AC
  Administered 2015-10-09: 650 mg via ORAL
  Filled 2015-10-09: qty 2

## 2015-10-09 MED ORDER — HYDROCHLOROTHIAZIDE 12.5 MG PO CAPS: 12.5000 mg | ORAL_CAPSULE | Freq: Every day | ORAL | Status: DC

## 2015-10-09 MED ORDER — IRBESARTAN 75 MG PO TABS
75.0000 mg | ORAL_TABLET | Freq: Every day | ORAL | Status: DC
  Administered 2015-10-11 – 2015-10-12 (×2): 75 mg via ORAL
  Filled 2015-10-09 (×4): qty 1

## 2015-10-09 MED ORDER — IOHEXOL 300 MG/ML  SOLN
25.0000 mL | Freq: Once | INTRAMUSCULAR | Status: AC | PRN
  Administered 2015-10-09: 50 mL via ORAL

## 2015-10-09 MED ORDER — IRBESARTAN 75 MG PO TABS: 75.0000 mg | ORAL_TABLET | Freq: Every day | ORAL | Status: DC

## 2015-10-09 NOTE — Progress Notes (Signed)
ANTIBIOTIC CONSULT NOTE - INITIAL  Pharmacy Consult for Vancomycin and Zosyn Indication: R/O Sepsis  Allergies  Allergen Reactions  . Simvastatin Other (See Comments)    cramping    Patient Measurements: Weight: 198 lb (89.812 kg)  Vital Signs: Temp: 99.4 F (37.4 C) (02/12 0402) Temp Source: Oral (02/12 0402) BP: 124/88 mmHg (02/12 0402) Pulse Rate: 96 (02/12 0402) Intake/Output from previous day: 02/11 0701 - 02/12 0700 In: 3250 [IV Piggyback:3250] Out: 150 [Urine:150] Intake/Output from this shift: Total I/O In: 3250 [IV Piggyback:3250] Out: 150 [Urine:150]  Labs:  Recent Labs  10/08/15 2349  WBC 19.0*  HGB 10.3*  PLT 404*  CREATININE 0.88   Estimated Creatinine Clearance: 87.7 mL/min (by C-G formula based on Cr of 0.88). No results for input(s): VANCOTROUGH, VANCOPEAK, VANCORANDOM, GENTTROUGH, GENTPEAK, GENTRANDOM, TOBRATROUGH, TOBRAPEAK, TOBRARND, AMIKACINPEAK, AMIKACINTROU, AMIKACIN in the last 72 hours.   Microbiology: Recent Results (from the past 720 hour(s))  Blood culture (routine x 2)     Status: None (Preliminary result)   Collection Time: 10/08/15 11:45 PM  Result Value Ref Range Status   Specimen Description BLOOD RIGHT ANTECUBITAL  Final   Special Requests BOTTLES DRAWN AEROBIC AND ANAEROBIC 10CC  Final   Culture PENDING  Incomplete   Report Status PENDING  Incomplete  Blood culture (routine x 2)     Status: None (Preliminary result)   Collection Time: 10/08/15 11:50 PM  Result Value Ref Range Status   Specimen Description BLOOD RIGHT HAND  Final   Special Requests BOTTLES DRAWN AEROBIC AND ANAEROBIC 10CC  Final   Culture PENDING  Incomplete   Report Status PENDING  Incomplete    Medical History: Past Medical History  Diagnosis Date  . Hypertension   . Frequent episodic tension-type headache   . Diverticulitis   . Gestational diabetes     Medications:  Vancomycin 1 Gm IV at 0030 at [facility]. Ceftriaxone 1 Gm IV at 0200  at [facility]. { Assessment: 45 yo G5P2A3 s/p robotic TLH/Bilateral salpingectomy on 09/29/15; seen at [facility] with fever, nausea, diarrhea, pelvic pain and dysuria. WBCs elevated. Empiric antibiotics to be started for r/o sepsis. Blood cultures pending.  Goal of Therapy:  Vancomycin troughs 15-20 mcg/ml Eradicate infection  Plan:  Vancomycin 1 Gm IV every 8 hours to be started @ 8 hours after the initial ED dose. Zosyn 3.375 Gm IV every 8 hours (4 hour infusion) Monitor renal function Vancomycin level with 3rd dose  Norberto Sorenson 10/09/2015,5:00 AM

## 2015-10-09 NOTE — Progress Notes (Signed)
   10/09/15 1531  Vital Signs  Temp (!) 103 F (39.4 C)  Dr. Dellis Filbert made aware of above temperature.  Orders received for tylenol 650mg  now x1 dose.

## 2015-10-09 NOTE — Progress Notes (Signed)
Per tech, wall unit thermometer was used to assess temp @ 2023 and dinamap was used to assess at 2142.  Tech took temp again with wall unit @ 2142 and read 100.5. Due to now known discrepancy in thermometers uncertain what pt temp truly was at 2023  Will reassess with dinamap @ 2240. Pt states headache is gone and is feeling better.

## 2015-10-09 NOTE — ED Notes (Signed)
C/o itching at R shoulder above infusion site, no urticaria or hives noted, scant redness, (denies: sob, swelling, tightness), LS CTA, no dyspnea noted, throat unremarkable. EDP aware, vancomycin rate slowed by half per Dr. Randal Buba.

## 2015-10-09 NOTE — H&P (Signed)
Kristin Herrera is an 45 y.o. female   RA:  Probable Acute PyeloNephritis with lower back pain, burning with miction, Fever and elevated WBC count.  HPI:  POp Robotic TLH/Bilat. Salpingectomy 09/29/2015.  Progressive worsening of burning with miction x yesterday.  Very painful mictions.  Fever at home >102 F.  No vaginal d/c or bleeding.  Pain in midline lower back.  Eating.  Passing gas, had BMs.  No N/V.    Pertinent Gynecological History:  Blood transfusions: none Sexually transmitted diseases: no past history Last mammogram: normal  Last pap: normal   Menstrual History:  Patient's last menstrual period was 09/08/2015.    Past Medical History  Diagnosis Date  . Hypertension   . Frequent episodic tension-type headache   . Diverticulitis   . Gestational diabetes     Past Surgical History  Procedure Laterality Date  . Cesarean section  1991, 1999  . Tubal ligation  1999  . Robotic assisted total hysterectomy with salpingectomy Bilateral 09/29/2015    Procedure: ROBOTIC ASSISTED TOTAL HYSTERECTOMY WITH SALPINGECTOMY;  Surgeon: Princess Bruins, MD;  Location: [facility];  Service: Gynecology;  Laterality: Bilateral;    Family History  Problem Relation Age of Onset  . Hypertension Mother   . Heart disease Father   . Heart disease Paternal Uncle   . Hypertension Maternal Grandmother   . Diabetes Maternal Grandmother   . Stroke Paternal Grandmother   . Hypertension Paternal Grandmother   . Diabetes Paternal Grandmother   . Heart disease Paternal Grandfather     Social History:  reports that she quit smoking about 5 years ago. Her smoking use included Cigarettes. She quit after 10 years of use. She has never used smokeless tobacco. She reports that she does not drink alcohol or use illicit drugs.  Allergies:  Allergies  Allergen Reactions  . Simvastatin Other (See Comments)    cramping    Prescriptions prior to admission  Medication Sig Dispense Refill Last Dose  . Iron  TABS Take 1 tablet by mouth daily.   Past Week at Unknown time  . oxyCODONE-acetaminophen (PERCOCET) 7.5-325 MG tablet Take 1 tablet by mouth every 4 (four) hours as needed for severe pain. 30 tablet 0 prn at Unknown time  . valsartan-hydrochlorothiazide (DIOVAN-HCT) 80-12.5 MG tablet Take 1 tablet by mouth daily. 30 tablet 5 10/08/2015 at Unknown time    ROS  Neg  Blood pressure 112/63, pulse 90, temperature 98.5 F (36.9 C), temperature source Oral, resp. rate 16, height 5\' 3"  (1.6 m), weight 198 lb (89.812 kg), last menstrual period 09/08/2015, SpO2 99 %. Physical Exam   NAD Lungs clear bilaterally, good A/E RCR, no murmur Abdo soft, NT, no distention.  BS present          Incisions healing well CVAT neg Lower limbs wnl  Results for orders placed or performed during the hospital encounter of 10/08/15 (from the past 24 hour(s))  Urinalysis, Routine w reflex microscopic (not at [facility])     Status: Abnormal   Collection Time: 10/08/15 10:00 PM  Result Value Ref Range   Color, Urine YELLOW YELLOW   APPearance CLEAR CLEAR   Specific Gravity, Urine 1.021 1.005 - 1.030   pH 6.0 5.0 - 8.0   Glucose, UA NEGATIVE NEGATIVE mg/dL   Hgb urine dipstick NEGATIVE NEGATIVE   Bilirubin Urine NEGATIVE NEGATIVE   Ketones, ur NEGATIVE NEGATIVE mg/dL   Protein, ur NEGATIVE NEGATIVE mg/dL   Nitrite NEGATIVE NEGATIVE   Leukocytes, UA SMALL (A)  NEGATIVE  Urine microscopic-add on     Status: Abnormal   Collection Time: 10/08/15 10:00 PM  Result Value Ref Range   Squamous Epithelial / LPF 0-5 (A) NONE SEEN   WBC, UA 6-30 0 - 5 WBC/hpf   RBC / HPF 0-5 0 - 5 RBC/hpf   Bacteria, UA MANY (A) NONE SEEN   Urine-Other MUCOUS PRESENT   Blood culture (routine x 2)     Status: None (Preliminary result)   Collection Time: 10/08/15 11:45 PM  Result Value Ref Range   Specimen Description BLOOD RIGHT ANTECUBITAL    Special Requests BOTTLES DRAWN AEROBIC AND ANAEROBIC 10CC    Culture PENDING    Report Status  PENDING   CBC with Differential/Platelet     Status: Abnormal   Collection Time: 10/08/15 11:49 PM  Result Value Ref Range   WBC 19.0 (H) 4.0 - 10.5 K/uL   RBC 4.34 3.87 - 5.11 MIL/uL   Hemoglobin 10.3 (L) 12.0 - 15.0 g/dL   HCT 33.6 (L) 36.0 - 46.0 %   MCV 77.4 (L) 78.0 - 100.0 fL   MCH 23.7 (L) 26.0 - 34.0 pg   MCHC 30.7 30.0 - 36.0 g/dL   RDW 15.4 11.5 - 15.5 %   Platelets 404 (H) 150 - 400 K/uL   Neutrophils Relative % 84 %   Neutro Abs 16.2 (H) 1.7 - 7.7 K/uL   Lymphocytes Relative 8 %   Lymphs Abs 1.4 0.7 - 4.0 K/uL   Monocytes Relative 7 %   Monocytes Absolute 1.3 (H) 0.1 - 1.0 K/uL   Eosinophils Relative 1 %   Eosinophils Absolute 0.1 0.0 - 0.7 K/uL   Basophils Relative 0 %   Basophils Absolute 0.0 0.0 - 0.1 K/uL  Comprehensive metabolic panel     Status: Abnormal   Collection Time: 10/08/15 11:49 PM  Result Value Ref Range   Sodium 138 135 - 145 mmol/L   Potassium 4.1 3.5 - 5.1 mmol/L   Chloride 104 101 - 111 mmol/L   CO2 23 22 - 32 mmol/L   Glucose, Bld 114 (H) 65 - 99 mg/dL   BUN 12 6 - 20 mg/dL   Creatinine, Ser 0.88 0.44 - 1.00 mg/dL   Calcium 9.0 8.9 - 10.3 mg/dL   Total Protein 7.9 6.5 - 8.1 g/dL   Albumin 3.6 3.5 - 5.0 g/dL   AST 15 15 - 41 U/L   ALT 11 (L) 14 - 54 U/L   Alkaline Phosphatase 74 38 - 126 U/L   Total Bilirubin 0.5 0.3 - 1.2 mg/dL   GFR calc non Af Amer >60 >60 mL/min   GFR calc Af Amer >60 >60 mL/min   Anion gap 11 5 - 15  Blood culture (routine x 2)     Status: None (Preliminary result)   Collection Time: 10/08/15 11:50 PM  Result Value Ref Range   Specimen Description BLOOD RIGHT HAND    Special Requests BOTTLES DRAWN AEROBIC AND ANAEROBIC 10CC    Culture PENDING    Report Status PENDING   I-Stat CG4 Lactic Acid, ED     Status: None   Collection Time: 10/08/15 11:55 PM  Result Value Ref Range   Lactic Acid, Venous 1.16 0.5 - 2.0 mmol/L    Dg Chest 2 View  10/08/2015  CLINICAL DATA:  45 year old female with nausea.   Postoperative. EXAM: CHEST  2 VIEW COMPARISON:  Radiograph dated 04/13/2013 FINDINGS: The heart size and mediastinal contours are within normal limits.  Both lungs are clear. The visualized skeletal structures are unremarkable. IMPRESSION: No active cardiopulmonary disease. Electronically Signed   By: Anner Crete M.D.   On: 10/08/2015 23:35    Assessment/Plan: Acute PN with UTI Sx, abnormal Urine micro, fever/elevated WBC count.  Responding to IV ABTx with Vanco and Zosyn.  Will complete the w-up with a CT scan of abdo-pelvic cavities to r/o a postop abcess.  Repeat CBC with diff tomorrow am.  Continue ABTx IV until tomorrow am.  If remains afebrile, will d/c on PO ABTx.  Lamone Ferrelli,MARIE-LYNE 10/09/2015, 12:09 PM

## 2015-10-10 LAB — CBC WITH DIFFERENTIAL/PLATELET
Basophils Absolute: 0 10*3/uL (ref 0.0–0.1)
Basophils Relative: 0 %
EOS ABS: 0.1 10*3/uL (ref 0.0–0.7)
EOS PCT: 1 %
HCT: 28 % — ABNORMAL LOW (ref 36.0–46.0)
Hemoglobin: 8.7 g/dL — ABNORMAL LOW (ref 12.0–15.0)
LYMPHS ABS: 1.9 10*3/uL (ref 0.7–4.0)
Lymphocytes Relative: 12 %
MCH: 23.7 pg — AB (ref 26.0–34.0)
MCHC: 31.1 g/dL (ref 30.0–36.0)
MCV: 76.3 fL — ABNORMAL LOW (ref 78.0–100.0)
MONOS PCT: 4 %
Monocytes Absolute: 0.7 10*3/uL (ref 0.1–1.0)
Neutro Abs: 13.3 10*3/uL — ABNORMAL HIGH (ref 1.7–7.7)
Neutrophils Relative %: 83 %
PLATELETS: 295 10*3/uL (ref 150–400)
RBC: 3.67 MIL/uL — ABNORMAL LOW (ref 3.87–5.11)
RDW: 15.6 % — ABNORMAL HIGH (ref 11.5–15.5)
WBC: 15.9 10*3/uL — ABNORMAL HIGH (ref 4.0–10.5)

## 2015-10-10 LAB — URINE CULTURE

## 2015-10-10 LAB — VANCOMYCIN, TROUGH: VANCOMYCIN TR: 12 ug/mL (ref 10.0–20.0)

## 2015-10-10 MED ORDER — VANCOMYCIN HCL 10 G IV SOLR
1250.0000 mg | Freq: Three times a day (TID) | INTRAVENOUS | Status: DC
  Administered 2015-10-10 – 2015-10-11 (×3): 1250 mg via INTRAVENOUS
  Filled 2015-10-10 (×5): qty 1250

## 2015-10-10 MED ORDER — ACETAMINOPHEN 325 MG PO TABS
650.0000 mg | ORAL_TABLET | ORAL | Status: DC | PRN
  Administered 2015-10-10: 650 mg via ORAL
  Filled 2015-10-10: qty 2

## 2015-10-10 NOTE — ED Notes (Signed)
Blood culture results called to Kristin Herrera. Room 306-1

## 2015-10-10 NOTE — Progress Notes (Signed)
  HD #2 Post op infection on Vanco/Rocephin IV Subjective: Patient reports that pain is well managed.  Tolerating normal diet as tolerated  diet without difficulty. No nausea / vomiting.  Ambulating and voiding.  Objective: BP 88/52 mmHg  Pulse 92  Temp(Src) 97.7 F (36.5 C) (Oral)  Resp 12  Ht 5\' 3"  (1.6 m)  Wt 198 lb (89.812 kg)  BMI 35.08 kg/m2  SpO2 98%  LMP 09/08/2015 Lungs: clear Heart: normal rate and rhythm Abdomen:soft and appropriately tender Extremities: No sign of DVT Incision: healing well  Results for Kristin Herrera, Kristin Herrera (MRN [id number]) as of 10/10/2015 08:33  Ref. Range 10/10/2015 05:17  WBC Latest Ref Range: 4.0-10.5 K/uL 15.9 (H)  RBC Latest Ref Range: 3.87-5.11 MIL/uL 3.67 (L)  Hemoglobin Latest Ref Range: 12.0-15.0 g/dL 8.7 (L)  HCT Latest Ref Range: 36.0-46.0 % 28.0 (L)  MCV Latest Ref Range: 78.0-100.0 fL 76.3 (L)  MCH Latest Ref Range: 26.0-34.0 pg 23.7 (L)  MCHC Latest Ref Range: 30.0-36.0 g/dL 31.1  RDW Latest Ref Range: 11.5-15.5 % 15.6 (H)  Platelets Latest Ref Range: 150-400 K/uL 295  Neutrophils Latest Units: % 83  Lymphocytes Latest Units: % 12  Monocytes Relative Latest Units: % 4  Eosinophil Latest Units: % 1  Basophil Latest Units: % 0  NEUT# Latest Ref Range: 1.7-7.7 K/uL 13.3 (H)  Lymphocyte # Latest Ref Range: 0.7-4.0 K/uL 1.9  Monocyte # Latest Ref Range: 0.1-1.0 K/uL 0.7  Eosinophils Absolute Latest Ref Range: 0.0-0.7 K/uL 0.1  Basophils Absolute Latest Ref Range: 0.0-0.1 K/uL 0.0   CT scan abdo-pelvis 10/09/2015  Results discussed with Dr Kathlene Cote:  No evidence of abcess at this point.  The fluid collection described on CT scan report is actually made of many small areas of inflammatory fluid.  Probably normal healing process 10 days post op.  U. Culture:  Multiple organisms (3 bact), no Uropathogen. Blood cultures  Negative so far.   Assessment: Probable post op pelvic infection.  Per discussion with Dr Kathlene Cote who reviewed the  CT scan, no current evidence of pelvic abscess (no unique collection, just small areas of inflammatory fluid), doesn't recommend drainage of fluid at this point, would probably not be possible and is not indicated.  Responding to IV ABTx Vanco and Rocephin, afebrile this am and WBC count decreased from 19 to 15.    Plan: Continue IV ABTx x 24 hrs afebrile.  Repeat CBC diff tomorrow am. Plan discussed with patient who voiced understanding and agreement.    Kristin Herrera,MARIE-LYNE 10/10/2015, 8:41 AM

## 2015-10-10 NOTE — ED Notes (Signed)
Blood culture results called to Izell Whittemore. RN

## 2015-10-10 NOTE — Progress Notes (Addendum)
Pharmacy Antibiotic Note  Kristin Herrera is a 45 y.o. female admitted on 10/08/2015 with R/O sepsis.  Pharmacy has been consulted for Vancomycin dosing.  Height: 5\' 3"  (160 cm) Weight: 198 lb (89.812 kg) IBW/kg (Calculated) : 52.4  Temp (24hrs), Avg:100 F (37.8 C), Min:97.7 F (36.5 C), Max:103 F (39.4 C)   Recent Labs Lab 10/08/15 2349 10/08/15 2355 10/10/15 0517 10/10/15 1054  WBC 19.0*  --  15.9*  --   CREATININE 0.88  --   --   --   LATICACIDVEN  --  1.16  --   --   VANCOTROUGH  --   --   --  12    Estimated Creatinine Clearance: 87.7 mL/min (by C-G formula based on Cr of 0.88).    Allergies  Allergen Reactions  . Simvastatin Other (See Comments)    cramping    Antimicrobials this admission: Anti-infectives    Start     Dose/Rate Route Frequency Ordered Stop   10/10/15 1900  vancomycin (VANCOCIN) 1,250 mg in sodium chloride 0.9 % 250 mL IVPB     1,250 mg 166.7 mL/hr over 90 Minutes Intravenous Every 8 hours 10/10/15 1408     10/09/15 1600  cefTRIAXone (ROCEPHIN) 2 g in dextrose 5 % 50 mL IVPB     2 g 100 mL/hr over 30 Minutes Intravenous Every 24 hours 10/09/15 1539     10/09/15 1000  vancomycin (VANCOCIN) IVPB 1000 mg/200 mL premix  Status:  Discontinued     1,000 mg 100 mL/hr over 120 Minutes Intravenous Every 8 hours 10/09/15 0443 10/10/15 1409   10/09/15 0600  piperacillin-tazobactam (ZOSYN) IVPB 3.375 g  Status:  Discontinued     3.375 g 12.5 mL/hr over 240 Minutes Intravenous 3 times per day 10/09/15 0440 10/09/15 1539   10/09/15 0045  cefTRIAXone (ROCEPHIN) 1 g in dextrose 5 % 50 mL IVPB     1 g 100 mL/hr over 30 Minutes Intravenous  Once 10/09/15 0036 10/09/15 0243   10/08/15 2330  vancomycin (VANCOCIN) IVPB 1000 mg/200 mL premix     1,000 mg 200 mL/hr over 60 Minutes Intravenous  Once 10/08/15 2315 10/09/15 0204      Dose adjustments this admission: Vancomycin levels drawn today at 10:54 reported as 12mg /L.  Pt. was receiving Vancomycin 1 g IV  Q8. with goal of trough 15-20mg /L.  Plan to increase dose to 1250mg  IV Q8 to start at 1900 tonight to achieve therapeutic range.   Microbiology results:  BCx:  Results for orders placed or performed during the hospital encounter of 10/08/15  Urine culture     Status: None   Collection Time: 10/08/15 10:00 PM  Result Value Ref Range Status   Specimen Description URINE, CLEAN CATCH  Final   Special Requests NONE  Final   Culture   Final    MULTIPLE SPECIES PRESENT, SUGGEST RECOLLECTION Performed at [facility]    Report Status 10/10/2015 FINAL  Final  Blood culture (routine x 2)     Status: None (Preliminary result)   Collection Time: 10/08/15 11:45 PM  Result Value Ref Range Status   Specimen Description BLOOD RIGHT ANTECUBITAL  Final   Special Requests BOTTLES DRAWN AEROBIC AND ANAEROBIC 10CC  Final   Culture   Final    NO GROWTH 1 DAY Performed at [facility]    Report Status PENDING  Incomplete  Blood culture (routine x 2)     Status: None (Preliminary result)   Collection Time:  10/08/15 11:50 PM  Result Value Ref Range Status   Specimen Description BLOOD RIGHT HAND  Final   Special Requests BOTTLES DRAWN AEROBIC AND ANAEROBIC 10CC  Final   Culture   Final    NO GROWTH 1 DAY Performed at [facility]    Report Status PENDING  Incomplete    Thank you for allowing pharmacy to be a part of this patient's care.  Hovey-Rankin, Beauregard Jarrells 10/10/2015 2:24 PM

## 2015-10-11 LAB — CBC WITH DIFFERENTIAL/PLATELET
BASOS ABS: 0 10*3/uL (ref 0.0–0.1)
BASOS PCT: 0 %
EOS ABS: 0.2 10*3/uL (ref 0.0–0.7)
EOS PCT: 1 %
HCT: 26 % — ABNORMAL LOW (ref 36.0–46.0)
Hemoglobin: 8.2 g/dL — ABNORMAL LOW (ref 12.0–15.0)
LYMPHS PCT: 12 %
Lymphs Abs: 1.9 10*3/uL (ref 0.7–4.0)
MCH: 24 pg — ABNORMAL LOW (ref 26.0–34.0)
MCHC: 31.5 g/dL (ref 30.0–36.0)
MCV: 76 fL — AB (ref 78.0–100.0)
MONO ABS: 0.9 10*3/uL (ref 0.1–1.0)
Monocytes Relative: 6 %
Neutro Abs: 12.6 10*3/uL — ABNORMAL HIGH (ref 1.7–7.7)
Neutrophils Relative %: 81 %
PLATELETS: 289 10*3/uL (ref 150–400)
RBC: 3.42 MIL/uL — ABNORMAL LOW (ref 3.87–5.11)
RDW: 15.6 % — AB (ref 11.5–15.5)
WBC: 15.6 10*3/uL — ABNORMAL HIGH (ref 4.0–10.5)

## 2015-10-11 NOTE — Progress Notes (Signed)
  HD #3  Rocephin IV and Vanco IV.  Patient seen this am 10/11/2015.  Subjective: Patient denies any abdo/pelvic or back pain.  Tolerating normal diet without difficulty. No nausea / vomiting.  Ambulating and voiding.  Gas and BM present.  Objective: BP 131/79 mmHg  Pulse 81  Temp(Src) 99.2 F (37.3 C) (Oral)  Resp 18  Ht 5\' 3"  (1.6 m)  Wt 198 lb (89.812 kg)  BMI 35.08 kg/m2  SpO2 98%  LMP 09/08/2015 Lungs: clear Heart: normal rate and rhythm Abdomen:soft and appropriately tender.  No distention. Extremities:  No sign of DVT Incision: Healing well.  No erythema, no d/c.  WBC from 15.9 to 15.6 this am. Blood culture:  Gram neg rods.  Sensitivity pending.  Assessment/Plan:  Postop Pelvic infection with Septicemia.  Responding to Rocephin IV/Vanco IV.  Afebrile x >24 hrs.  Given Blood culture pos for Gram neg rods, will continue Rocephin and d/c Vanco.  Continue with IV Rocephin x at least 48 hrs afebrile.   Per lab, Sensitivity should be available tomorrow am, will wait on that to decide on PO ABTx at d/c.   Dvon Jiles,MARIE-LYNE 10/11/2015, 11:21 PM

## 2015-10-12 LAB — CBC WITH DIFFERENTIAL/PLATELET
Basophils Absolute: 0 10*3/uL (ref 0.0–0.1)
Basophils Relative: 0 %
EOS PCT: 2 %
Eosinophils Absolute: 0.2 10*3/uL (ref 0.0–0.7)
HEMATOCRIT: 25.2 % — AB (ref 36.0–46.0)
Hemoglobin: 7.9 g/dL — ABNORMAL LOW (ref 12.0–15.0)
LYMPHS ABS: 2.5 10*3/uL (ref 0.7–4.0)
Lymphocytes Relative: 21 %
MCH: 23.7 pg — ABNORMAL LOW (ref 26.0–34.0)
MCHC: 31.3 g/dL (ref 30.0–36.0)
MCV: 75.4 fL — AB (ref 78.0–100.0)
MONO ABS: 0.5 10*3/uL (ref 0.1–1.0)
Monocytes Relative: 4 %
NEUTROS ABS: 8.9 10*3/uL — AB (ref 1.7–7.7)
NEUTROS PCT: 73 %
Other: 0 %
PLATELETS: 329 10*3/uL (ref 150–400)
RBC: 3.34 MIL/uL — AB (ref 3.87–5.11)
RDW: 15.6 % — AB (ref 11.5–15.5)
WBC: 12.1 10*3/uL — AB (ref 4.0–10.5)

## 2015-10-12 MED ORDER — METRONIDAZOLE 500 MG PO TABS: 500.0000 mg | ORAL_TABLET | Freq: Three times a day (TID) | ORAL | Status: AC

## 2015-10-12 MED ORDER — CIPROFLOXACIN HCL 500 MG PO TABS: 500.0000 mg | ORAL_TABLET | Freq: Two times a day (BID) | ORAL | Status: AC

## 2015-10-12 MED ORDER — FERROUS SULFATE 325 (65 FE) MG PO TABS: 325.0000 mg | ORAL_TABLET | Freq: Three times a day (TID) | ORAL | Status: DC

## 2015-10-12 MED ORDER — FERROUS SULFATE 325 (65 FE) MG PO TABS
325.0000 mg | ORAL_TABLET | Freq: Three times a day (TID) | ORAL | Status: DC
  Administered 2015-10-12: 325 mg via ORAL
  Filled 2015-10-12: qty 1

## 2015-10-12 NOTE — Progress Notes (Signed)
10/12/2015 at 2:04 pm  Blood culture:  Gram neg Rod.  Per Lab, sensitivity will not be available until tomorrow.  Good response to Rocephin IV.  After consulting with Infectious Disease Pharmacist, decision to switch to Cipro 500 BID and Flagyl 500 TID x 10 days for best coverage of Gram neg Rods.  Rx sent to patient's pharmacy. Will be reevaluated immediately if fever returns. If doing well, f/u with me at [facility] in 1 week.  Princess Bruins  MD

## 2015-10-12 NOTE — Discharge Instructions (Signed)
Bacteremia °Bacteremia is the presence of bacteria in the blood. A small amount of bacteria may not cause any symptoms. °Sometimes, the bacteria spread and cause infection in other parts of the body, such as the heart, joints, bones, or brain. Having a great amount of bacteria can cause a serious, sometimes life-threatening infection called sepsis. °CAUSES °This condition is caused by bacteria that get into the blood. Bacteria can enter the blood: °· During a dental or medical procedure. °· After you brush your teeth so hard that the gums bleed. °· Through a scrape or cut on your skin. °More severe types of bacteremia can be caused by: °· A bacterial infection, such as pneumonia, that spreads to the blood. °· Using a dirty needle. °RISK FACTORS °This condition is more likely to develop in: °· Children and elderly adults. °· People who have a long-lasting (chronic) disease or medical condition. °· People who have an artificial joint or heart valve. °· People who have heart valve disease. °· People who have a tube, such as a catheter or IV tube, that has been inserted for a medical treatment. °· People who have a weak body defense system (immune system). °· People who use IV drugs. °SYMPTOMS °Usually, this condition does not cause symptoms when it is mild. When it is more serious, it may cause: °· Fever. °· Chills. °· Racing heart. °· Shortness of breath. °· Dizziness. °· Weakness. °· Confusion. °· Nausea or vomiting. °· Diarrhea. °Bacteremia that has spread to other parts of the body may cause symptoms in those areas. °DIAGNOSIS °This condition may be diagnosed with a physical exam and tests, such as: °· A complete blood count (CBC). This test looks for signs of infection. °· Blood cultures. These look for bacteria in your blood. °· Tests of any IV tubes. These look for a source of infection. °· Urine tests. °· Imaging tests, such as an X-ray, CT scan, MRI, or heart ultrasound. °TREATMENT °If the condition is mild,  treatment is usually not needed. Usually, the body's immune system will remove the bacteria. If the condition is more serious, it may be treated with: °· Antibiotic medicines through an IV tube. These may be given for about 2 weeks. At first, the antibiotic that is given may kill most types of blood bacteria. If your test results show that a certain kind of bacteria is causing problems, the antibiotic may be changed to kill only the bacteria that are causing problems. °· Antibiotics taken by mouth. °· Removing any catheter or IV tube that is a source of infection. °· Blood pressure and breathing support, if needed. °· Surgery to control the source or spread of infection, if needed. °HOME CARE INSTRUCTIONS °· Take over-the-counter and prescription medicines only as told by your health care provider. °· If you were prescribed an antibiotic, take it as told by your health care provider. Do not stop taking the antibiotic even if you start to feel better. °· Rest at home until your condition is under control. °· Drink enough fluid to keep your urine clear or pale yellow. °· Keep all follow-up visits as told by your health care provider. This is important. °PREVENTION °Take these actions to help prevent future episodes of bacteremia: °· Get all vaccinations as recommended by your health care provider. °· Clean and cover scrapes or cuts. °· Bathe regularly. °· Wash your hands often. °· Before any dental or surgical procedure, ask your health care provider if you should take an antibiotic. °SEEK MEDICAL   CARE IF: °· Your symptoms get worse. °· You continue to have symptoms after treatment. °· You develop new symptoms after treatment. °SEEK IMMEDIATE MEDICAL CARE IF: °· You have chest pain or trouble breathing. °· You develop confusion, dizziness, or weakness. °· You develop pale skin. °  °This information is not intended to replace advice given to you by your health care provider. Make sure you discuss any questions you have  with your health care provider. °  °Document Released: 05/27/2006 Document Revised: 05/04/2015 Document Reviewed: 10/16/2014 °Elsevier Interactive Patient Education ©2016 [employer] ° °

## 2015-10-12 NOTE — Progress Notes (Signed)
Pt verbalizes understanding of d/c instructions, medications, follow up appts, when to seek medical attention and belongings policy. Pt has no questions at this time. Pt was encouraged to check her room thoroughly for belongings prior to leaving. Pts IV was d/c without complications. Pts husband is driving her home and just called and is at main entrance. I escorted pt to the main entrance and into the front seat of her husbands truck. Pt was given a copy of d/c instructions and her prescriptions at time of d/c. Kristin Herrera

## 2015-10-12 NOTE — Progress Notes (Signed)
HD # 4  Rocephin IV for Pelvic Infection/Septicemia.  Subjective: Patient reports that pain is well managed.  Tolerating normal diet as tolerated  diet without difficulty. No nausea / vomiting.  Ambulating and voiding.  Objective: BP 111/64 mmHg  Pulse 75  Temp(Src) 98.5 F (36.9 C) (Oral)  Resp 18  Ht 5\' 3"  (1.6 m)  Wt 198 lb (89.812 kg)  BMI 35.08 kg/m2  SpO2 98%  LMP 09/08/2015 Lungs: clear Heart: normal rate and rhythm Abdomen:soft and appropriately tender Extremities: Homans sign is negative, no sign of DVT Incision: healing well  Results for Kristin Herrera, Kristin Herrera (MRN [id number]) as of 10/12/2015 09:07  Ref. Range 10/12/2015 05:40  WBC Latest Ref Range: 4.0-10.5 K/uL 12.1 (H)  RBC Latest Ref Range: 3.87-5.11 MIL/uL 3.34 (L)  Hemoglobin Latest Ref Range: 12.0-15.0 g/dL 7.9 (L)  HCT Latest Ref Range: 36.0-46.0 % 25.2 (L)  MCV Latest Ref Range: 78.0-100.0 fL 75.4 (L)  MCH Latest Ref Range: 26.0-34.0 pg 23.7 (L)  MCHC Latest Ref Range: 30.0-36.0 g/dL 31.3  RDW Latest Ref Range: 11.5-15.5 % 15.6 (H)  Platelets Latest Ref Range: 150-400 K/uL 329  Neutrophils Latest Units: % 73  Lymphocytes Latest Units: % 21  Monocytes Relative Latest Units: % 4  Eosinophil Latest Units: % 2  Basophil Latest Units: % 0  NEUT# Latest Ref Range: 1.7-7.7 K/uL 8.9 (H)  Lymphocyte # Latest Ref Range: 0.7-4.0 K/uL 2.5  Monocyte # Latest Ref Range: 0.1-1.0 K/uL 0.5  Eosinophils Absolute Latest Ref Range: 0.0-0.7 K/uL 0.2  Basophils Absolute Latest Ref Range: 0.0-0.1 K/uL 0.0  Smear Review Unknown WHITE COUNT CONFI...  Other Latest Units: % 0    Assessment: Postop Pelvic infection with Septicemia, responding to Rocephin IV.  Afebrile x >48 hrs.  WBC count decreased to 12.1 Blood cultures Gram neg Rods.  Pending Sensitivity.   Stable Hb at 7.9 (8.2 yesterday)  Plan: Will obtain Sensitivity today.  Consider d/c on appropriate PO ABTx at that time. Start FeSO4 325  TID  Kristin Herrera,MARIE-LYNE 10/12/2015, 9:09 AM

## 2015-10-12 NOTE — Discharge Summary (Signed)
      Physician Discharge Summary  Patient ID: Kristin Herrera MRN: [id number] DOB/AGE: July 11, 1971 45 y.o.  Admit date: 10/08/2015 Discharge date: 10/12/2015  Admission Diagnoses: Pyelonephritis [N12] SIRS (systemic inflammatory response syndrome) (HCC) [R65.10]  Postop pelvic infection, Septicemia  Discharge Diagnoses: Pyelonephritis [N12] SIRS (systemic inflammatory response syndrome) (HCC) [R65.10]        Active Problems:   SIRS (systemic inflammatory response syndrome) (HCC) Postop pelvic infection, Septicemia  Discharged Condition: good  Hospital Course: Good response to IV Antibiotics  Consults: Infectious Disease Pharmacist  Treatments:  Rocephin IV and Vancomycin IV  Disposition: 01-Home or Self Care     Medication List    STOP taking these medications        Iron Tabs     oxyCODONE-acetaminophen 7.5-325 MG tablet  Commonly known as:  PERCOCET      TAKE these medications        ciprofloxacin 500 MG tablet  Commonly known as:  CIPRO  Take 1 tablet (500 mg total) by mouth 2 (two) times daily.     ferrous sulfate 325 (65 FE) MG tablet  Take 1 tablet (325 mg total) by mouth 3 (three) times daily with meals.     metroNIDAZOLE 500 MG tablet  Commonly known as:  FLAGYL  Take 1 tablet (500 mg total) by mouth 3 (three) times daily.     valsartan-hydrochlorothiazide 80-12.5 MG tablet  Commonly known as:  DIOVAN-HCT  Take 1 tablet by mouth daily.           Follow-up Information    Follow up with Cayden Granholm,MARIE-LYNE, MD In 1 week.   Specialty:  Obstetrics and Gynecology   Contact information:   [address] [place] [address] [phone]       Signed: Princess Bruins, MD 10/12/2015, 2:23 PM

## 2015-10-13 LAB — CULTURE, BLOOD (ROUTINE X 2)

## 2015-10-14 LAB — CULTURE, BLOOD (ROUTINE X 2): CULTURE: NO GROWTH

## 2015-10-28 ENCOUNTER — Ambulatory Visit (HOSPITAL_BASED_OUTPATIENT_CLINIC_OR_DEPARTMENT_OTHER)
Admission: RE | Admit: 2015-10-28 | Discharge: 2015-10-28 | Disposition: A | Discharge status: Home / Self Care | Payer: PRIVATE HEALTH INSURANCE | Source: Ambulatory Visit | Attending: Family | Admitting: Family

## 2015-10-28 DIAGNOSIS — Z0000 Encounter for general adult medical examination without abnormal findings: Secondary | ICD-10-CM | POA: Insufficient documentation

## 2015-10-28 DIAGNOSIS — Z1231 Encounter for screening mammogram for malignant neoplasm of breast: Secondary | ICD-10-CM | POA: Insufficient documentation

## 2015-12-02 ENCOUNTER — Ambulatory Visit: Payer: PRIVATE HEALTH INSURANCE | Admitting: Family

## 2015-12-02 ENCOUNTER — Telehealth: Payer: Self-pay | Admitting: Family

## 2015-12-02 DIAGNOSIS — Z0289 Encounter for other administrative examinations: Secondary | ICD-10-CM

## 2015-12-05 NOTE — Telephone Encounter (Signed)
Pt was no show 12/02/15 8:30am for follow up visit, pt has not rescheduled, 1st no show w/in 12 months, charge or no charge?

## 2015-12-07 ENCOUNTER — Encounter: Payer: Self-pay | Admitting: Family

## 2015-12-07 NOTE — Telephone Encounter (Signed)
Marked to charge and mailing no show letter °

## 2015-12-07 NOTE — Telephone Encounter (Signed)
Yes please

## 2016-04-03 ENCOUNTER — Emergency Department (HOSPITAL_BASED_OUTPATIENT_CLINIC_OR_DEPARTMENT_OTHER)
Admission: EM | Admit: 2016-04-03 | Discharge: 2016-04-03 | Disposition: A | Discharge status: Home / Self Care | Payer: PRIVATE HEALTH INSURANCE | Attending: Emergency Medicine | Admitting: Emergency Medicine

## 2016-04-03 ENCOUNTER — Encounter (HOSPITAL_BASED_OUTPATIENT_CLINIC_OR_DEPARTMENT_OTHER): Payer: Self-pay | Admitting: *Deleted

## 2016-04-03 DIAGNOSIS — S39012A Strain of muscle, fascia and tendon of lower back, initial encounter: Secondary | ICD-10-CM

## 2016-04-03 DIAGNOSIS — Y999 Unspecified external cause status: Secondary | ICD-10-CM | POA: Insufficient documentation

## 2016-04-03 DIAGNOSIS — Y929 Unspecified place or not applicable: Secondary | ICD-10-CM | POA: Diagnosis not present

## 2016-04-03 DIAGNOSIS — X58XXXA Exposure to other specified factors, initial encounter: Secondary | ICD-10-CM | POA: Insufficient documentation

## 2016-04-03 DIAGNOSIS — Y939 Activity, unspecified: Secondary | ICD-10-CM | POA: Insufficient documentation

## 2016-04-03 DIAGNOSIS — Z79899 Other long term (current) drug therapy: Secondary | ICD-10-CM | POA: Diagnosis not present

## 2016-04-03 DIAGNOSIS — I10 Essential (primary) hypertension: Secondary | ICD-10-CM | POA: Diagnosis not present

## 2016-04-03 DIAGNOSIS — Z87891 Personal history of nicotine dependence: Secondary | ICD-10-CM | POA: Insufficient documentation

## 2016-04-03 DIAGNOSIS — S3992XA Unspecified injury of lower back, initial encounter: Secondary | ICD-10-CM | POA: Diagnosis present

## 2016-04-03 DIAGNOSIS — M545 Low back pain, unspecified: Secondary | ICD-10-CM

## 2016-04-03 DIAGNOSIS — M62830 Muscle spasm of back: Secondary | ICD-10-CM

## 2016-04-03 MED ORDER — HYDROCODONE-ACETAMINOPHEN 5-325 MG PO TABS: 1.0000 | ORAL_TABLET | Freq: Four times a day (QID) | ORAL | 0 refills | Status: DC | PRN

## 2016-04-03 MED ORDER — CYCLOBENZAPRINE HCL 5 MG PO TABS: 5.0000 mg | ORAL_TABLET | Freq: Two times a day (BID) | ORAL | 0 refills | Status: DC | PRN

## 2016-04-03 MED ORDER — NAPROXEN 500 MG PO TABS: 500.0000 mg | ORAL_TABLET | Freq: Two times a day (BID) | ORAL | 0 refills | Status: DC

## 2016-04-03 NOTE — Discharge Instructions (Signed)
SEEK IMMEDIATE MEDICAL ATTENTION IF: New numbness, tingling, weakness, or problem with the use of your arms or legs.  Severe back pain not relieved with medications.  Change in bowel or bladder control.  Increasing pain in any areas of the body (such as chest or abdominal pain).  Shortness of breath, dizziness or fainting.  Nausea (feeling sick to your stomach), vomiting, fever, or sweats.  

## 2016-04-03 NOTE — ED Provider Notes (Signed)
[name] Provider Note   CSN: [id number] Arrival date & time: 04/03/16  [id number]  First Provider Contact:  First MD Initiated Contact with Patient 04/03/16 1018        History   Chief Complaint Chief Complaint  Patient presents with  . Back Pain    HPI Kristin Herrera is a 45 y.o. female who presents for acute low back pain. Patient states that 2 days ago she was leaning over to turn on her bathtub when she had sudden acute lumbar pain, which she describes as sharp, and tight. She is unable to stand back up. She had to get her daughter to come help her to the bed. She tried to rest and ibuprofen along with heat packs and ice packs to has found minimal relief. Her pain is worse with any movement of the left lumbar spine. She is able to ambulate, however, standing upright causes her significant pain in the lumbar spine. She has less pain when walking slightly bent over. She denies recent back procedures, IV drug use, loss of control of bowel or bladder, radiating pain down the legs, weakness, saddle anesthesia. She has no history of similar symptoms.  HPI  Past Medical History:  Diagnosis Date  . Diverticulitis   . Frequent episodic tension-type headache   . Gestational diabetes   . Hypertension     Patient Active Problem List   Diagnosis Date Noted  . SIRS (systemic inflammatory response syndrome) ([place]) 10/09/2015  . Post-operative state 09/29/2015  . Preventative health care 08/02/2015  . Syphilis 04/14/2015  . Anemia, iron deficiency 04/11/2015  . History of syphilis 04/11/2015  . Hyperlipemia 10/19/2013  . Pain in joint, shoulder region 09/21/2013  . Menorrhagia 09/03/2012  . Knee pain 09/04/2011  . Borderline diabetes 06/11/2011  . General medical examination 06/04/2011  . Thyromegaly 06/04/2011  . HTN (hypertension) 05/04/2011    Past Surgical History:  Procedure Laterality Date  . ABDOMINAL HYSTERECTOMY    . [place]  . ROBOTIC  ASSISTED TOTAL HYSTERECTOMY WITH SALPINGECTOMY Bilateral 09/29/2015   Procedure: ROBOTIC ASSISTED TOTAL HYSTERECTOMY WITH SALPINGECTOMY;  Surgeon: Princess Bruins, MD;  Location: [facility];  Service: Gynecology;  Laterality: Bilateral;  . TUBAL LIGATION  1999    OB History    No data available       Home Medications    Prior to Admission medications   Medication Sig Start Date End Date Taking? Authorizing Provider  valsartan-hydrochlorothiazide (DIOVAN-HCT) 80-12.5 MG tablet Take 1 tablet by mouth daily. 09/14/15  Yes Debbrah Alar, NP  cyclobenzaprine (FLEXERIL) 5 MG tablet Take 1-2 tablets (5-10 mg total) by mouth 2 (two) times daily as needed for muscle spasms. 04/03/16   Margarita Mail, PA-C  ferrous sulfate 325 (65 FE) MG tablet Take 1 tablet (325 mg total) by mouth 3 (three) times daily with meals. 10/12/15   Princess Bruins, MD  HYDROcodone-acetaminophen (NORCO) 5-325 MG tablet Take 1-2 tablets by mouth every 6 (six) hours as needed for severe pain (only for severe pain). 04/03/16   Margarita Mail, PA-C  naproxen (NAPROSYN) 500 MG tablet Take 1 tablet (500 mg total) by mouth 2 (two) times daily with a meal. 04/03/16   Margarita Mail, PA-C    Family History Family History  Problem Relation Age of Onset  . Hypertension Mother   . Heart disease Father   . Heart disease Paternal Uncle   . Heart disease Paternal Grandfather   . Hypertension Maternal Grandmother   .  Diabetes Maternal Grandmother   . Stroke Paternal Grandmother   . Hypertension Paternal Grandmother   . Diabetes Paternal Grandmother     Social History Social History  Substance Use Topics  . Smoking status: Former Smoker    Years: 10.00    Types: Cigarettes    Quit date: 03/10/2010  . Smokeless tobacco: Never Used  . Alcohol use No     Allergies   Simvastatin   Review of Systems Review of Systems  Ten systems reviewed and are negative for acute change, except as noted in the HPI.   Physical  Exam Updated Vital Signs BP 157/94 (BP Location: Left Arm)   Pulse 102   Temp 98.2 F (36.8 C) (Oral)   Resp 18   Ht 5\' 2"  (1.575 m)   Wt 93 kg   LMP 09/08/2015   SpO2 100%   BMI 37.49 kg/m   Physical Exam  Constitutional: She is oriented to person, place, and time. She appears well-developed and well-nourished. No distress.  Patient lying flat on her back.   HENT:  Head: Normocephalic and atraumatic.  Eyes: Conjunctivae are normal. No scleral icterus.  Neck: Normal range of motion.  Cardiovascular: Normal rate, regular rhythm and normal heart sounds.  Exam reveals no gallop and no friction rub.   No murmur heard. Pulmonary/Chest: Effort normal and breath sounds normal. No respiratory distress.  Abdominal: Soft. Bowel sounds are normal. She exhibits no distension and no mass. There is no tenderness. There is no guarding.  Musculoskeletal:  ttp with acute spasm of the lumbar musculature. No psoas tenderness. Pain with passive stretching of the lumbar. All lumbar movement is guarded. Ambulates in a bent over position. Normal DTRs and strength. NVI  Neurological: She is alert and oriented to person, place, and time.  Skin: Skin is warm and dry. She is not diaphoretic.  Nursing note and vitals reviewed.    ED Treatments / Results  Labs (all labs ordered are listed, but only abnormal results are displayed) Labs Reviewed - No data to display  EKG  EKG Interpretation None       Radiology No results found.  Procedures Procedures (including critical care time)  Medications Ordered in ED Medications - No data to display   Initial Impression / Assessment and Plan / ED Course  I have reviewed the triage vital signs and the nursing notes.  Pertinent labs & imaging results that were available during my care of the patient were reviewed by me and considered in my medical decision making (see chart for details).  Clinical Course    Patient with back pain.  No  neurological deficits and normal neuro exam.  Patient can walk but states is painful.  No loss of bowel or bladder control.  No concern for cauda equina.  No fever, night sweats, weight loss, h/o cancer, IVDU.  RICE protocol and pain medicine indicated and discussed with patient.   NCCSRS: reviewed with no controlled substances for the past 6 months.  Final Clinical Impressions(s) / ED Diagnoses   Final diagnoses:  Acute low back pain  Spasm of back muscles  Low back strain, initial encounter    New Prescriptions New Prescriptions   CYCLOBENZAPRINE (FLEXERIL) 5 MG TABLET    Take 1-2 tablets (5-10 mg total) by mouth 2 (two) times daily as needed for muscle spasms.   HYDROCODONE-ACETAMINOPHEN (NORCO) 5-325 MG TABLET    Take 1-2 tablets by mouth every 6 (six) hours as needed for severe pain (only  for severe pain).   NAPROXEN (NAPROSYN) 500 MG TABLET    Take 1 tablet (500 mg total) by mouth 2 (two) times daily with a meal.     Margarita Mail, PA-C 04/03/16 [address], PA-C 04/03/16 Renfrow Yao, MD 04/03/16 1517

## 2016-04-03 NOTE — ED Triage Notes (Signed)
C/o low back pain after bending over to turn ontub yesterday. Pain improved by laying flat. Denies urinary sx

## 2016-04-03 NOTE — ED Notes (Signed)
Pt directed to pharmacy to pick up prescriptions

## 2016-04-03 NOTE — ED Notes (Signed)
PA at bedside.

## 2016-05-11 ENCOUNTER — Ambulatory Visit: Payer: PRIVATE HEALTH INSURANCE | Admitting: Family

## 2016-05-11 DIAGNOSIS — Z0289 Encounter for other administrative examinations: Secondary | ICD-10-CM

## 2016-07-30 ENCOUNTER — Telehealth: Payer: Self-pay | Admitting: Family

## 2016-07-30 ENCOUNTER — Ambulatory Visit (INDEPENDENT_AMBULATORY_CARE_PROVIDER_SITE_OTHER): Payer: PRIVATE HEALTH INSURANCE | Admitting: Family

## 2016-07-30 ENCOUNTER — Encounter: Payer: Self-pay | Admitting: Family

## 2016-07-30 VITALS — BP 121/81 | HR 91 | Temp 98.6°F | Resp 16 | Ht 62.0 in | Wt 206.0 lb

## 2016-07-30 DIAGNOSIS — I10 Essential (primary) hypertension: Secondary | ICD-10-CM | POA: Diagnosis not present

## 2016-07-30 DIAGNOSIS — Z8619 Personal history of other infectious and parasitic diseases: Secondary | ICD-10-CM | POA: Diagnosis not present

## 2016-07-30 DIAGNOSIS — D509 Iron deficiency anemia, unspecified: Secondary | ICD-10-CM

## 2016-07-30 NOTE — Progress Notes (Signed)
Subjective:    Patient ID: Kristin Herrera, female    DOB: [birth year], 45 y.o.   MRN: [id number]  HPI  Kristin Herrera is a 45 yr old female who presents today for follow up of her hypertension. She reports feeling well on  on diovan HCT. Denies CP/SOB or swelling.  BP Readings from Last 3 Encounters:  07/30/16 121/81  04/03/16 157/94  10/12/15 128/83     Review of Systems See HPI  Past Medical History:  Diagnosis Date  . Diverticulitis   . Frequent episodic tension-type headache   . Gestational diabetes   . Hypertension      Social History   Social History  . Marital status: Married    Spouse name: N/A  . Number of children: 2  . Years of education: N/A   Occupational History  . 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 Smoking status: Former Smoker    Years: 10.00    Types: Cigarettes    Quit date: 03/10/2010  . Smokeless tobacco: Never Used  . Alcohol use No  . Drug use: No  . Sexual activity: Yes    Birth control/ protection: Surgical   Other Topics Concern  . Not on file   Social History Narrative   Caffeine Use:  1 daily   Regular exercise:  No   Married   2 children Son age 55 and daughter age 60   Works at [employer] as a Nurse, learning disability.   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     Complete 12 th grade                Past Surgical History:  Procedure Laterality Date  . ABDOMINAL HYSTERECTOMY    . [place]  . ROBOTIC ASSISTED TOTAL HYSTERECTOMY WITH SALPINGECTOMY Bilateral 09/29/2015   Procedure: ROBOTIC ASSISTED TOTAL HYSTERECTOMY WITH SALPINGECTOMY;  Surgeon: Princess Bruins, MD;  Location: [facility];  Service: Gynecology;  Laterality: Bilateral;  . TUBAL LIGATION  1999    Family History  Problem Relation Age of Onset  . Hypertension Mother   . Heart disease Father   . Heart disease Paternal Uncle   . Heart disease Paternal Grandfather   . Hypertension Maternal Grandmother   . Diabetes  Maternal Grandmother   . Stroke Paternal Grandmother   . Hypertension Paternal Grandmother   . Diabetes Paternal Grandmother     Allergies  Allergen Reactions  . Simvastatin Other (See Comments)    cramping    Current Outpatient Prescriptions on File Prior to Visit  Medication Sig Dispense Refill  . cyclobenzaprine (FLEXERIL) 5 MG tablet Take 1-2 tablets (5-10 mg total) by mouth 2 (two) times daily as needed for muscle spasms. 12 tablet 0  . ferrous sulfate 325 (65 FE) MG tablet Take 1 tablet (325 mg total) by mouth 3 (three) times daily with meals. 90 tablet 3  . HYDROcodone-acetaminophen (NORCO) 5-325 MG tablet Take 1-2 tablets by mouth every 6 (six) hours as needed for severe pain (only for severe pain). 10 tablet 0  . naproxen (NAPROSYN) 500 MG tablet Take 1 tablet (500 mg total) by mouth 2 (two) times daily with a meal. 30 tablet 0  . valsartan-hydrochlorothiazide (DIOVAN-HCT) 80-12.5 MG tablet Take 1 tablet by mouth daily. 30 tablet 5   No current facility-administered medications on file prior to visit.     BP 121/81 (BP Location: Right Arm, Cuff Size: Normal)   Pulse 91   Temp  98.6 F (37 C) (Oral)   Resp 16   Ht 5\' 2"  (1.575 m)   Wt 206 lb (93.4 kg)   LMP 09/08/2015   SpO2 100%   BMI 37.68 kg/m       Objective:   Physical Exam  Constitutional: She is oriented to person, place, and time. She appears well-developed and well-nourished.  HENT:  Head: Normocephalic and atraumatic.  Cardiovascular: Normal rate, regular rhythm and normal heart sounds.   No murmur heard. Pulmonary/Chest: Effort normal and breath sounds normal. No respiratory distress. She has no wheezes.  Musculoskeletal: She exhibits no edema.  Neurological: She is alert and oriented to person, place, and time.  Psychiatric: She has a normal mood and affect. Her behavior is normal. Judgment and thought content normal.          Assessment & Plan:  Hx of syphilis- I reviewed her chart and it  appears that she has not followed through with her 2 referrals to ID.  Will re-initiate.  (see phone noted 07/30/16).    Iron def anemia- she is s/p hysterectomy. No longer taking iron supplement. Will obtain follow up iron level and CBC.

## 2016-07-30 NOTE — Telephone Encounter (Signed)
Please contact patient and let her know that I reviewed her chart and see that she did not see ID for follow up of her her + syphilis testing. It is important that she follow through with this appointment.  I have pended referral.

## 2016-07-30 NOTE — Assessment & Plan Note (Signed)
Stable, continue diovan HCT.  Obtain follow up bmet to assess electrolytes and kidney function.

## 2016-07-30 NOTE — Progress Notes (Signed)
Pre visit review using our clinic review tool, if applicable. No additional management support is needed unless otherwise documented below in the visit note. 

## 2016-07-30 NOTE — Patient Instructions (Signed)
Please complete lab work prior to leaving. Schedule a complete physical at the front desk.  

## 2016-07-31 LAB — CBC WITH DIFFERENTIAL/PLATELET
Basophils Absolute: 0.1 10*3/uL (ref 0.0–0.1)
Basophils Relative: 0.4 % (ref 0.0–3.0)
EOS ABS: 0.1 10*3/uL (ref 0.0–0.7)
EOS PCT: 0.7 % (ref 0.0–5.0)
HCT: 43.7 % (ref 36.0–46.0)
HEMOGLOBIN: 14.6 g/dL (ref 12.0–15.0)
LYMPHS ABS: 2.7 10*3/uL (ref 0.7–4.0)
Lymphocytes Relative: 18.9 % (ref 12.0–46.0)
MCHC: 33.5 g/dL (ref 30.0–36.0)
MCV: 88.4 fl (ref 78.0–100.0)
MONO ABS: 0.7 10*3/uL (ref 0.1–1.0)
Monocytes Relative: 4.7 % (ref 3.0–12.0)
NEUTROS PCT: 75.3 % (ref 43.0–77.0)
Neutro Abs: 10.6 10*3/uL — ABNORMAL HIGH (ref 1.4–7.7)
Platelets: 279 10*3/uL (ref 150.0–400.0)
RBC: 4.94 Mil/uL (ref 3.87–5.11)
RDW: 14.4 % (ref 11.5–15.5)
WBC: 14.1 10*3/uL — AB (ref 4.0–10.5)

## 2016-07-31 LAB — BASIC METABOLIC PANEL
BUN: 15 mg/dL (ref 6–23)
CALCIUM: 9.9 mg/dL (ref 8.4–10.5)
CO2: 29 meq/L (ref 19–32)
Chloride: 101 mEq/L (ref 96–112)
Creatinine, Ser: 1 mg/dL (ref 0.40–1.20)
GFR: 77.46 mL/min (ref >60.00)
Glucose, Bld: 104 mg/dL — ABNORMAL HIGH (ref 70–99)
POTASSIUM: 4.7 meq/L (ref 3.5–5.1)
SODIUM: 138 meq/L (ref 135–145)

## 2016-07-31 LAB — IRON: IRON: 58 ug/dL (ref 42–145)

## 2016-08-06 NOTE — Telephone Encounter (Signed)
Left message for pt to return my call.

## 2016-08-06 NOTE — Telephone Encounter (Signed)
Spoke with pt and confirmed that her last visit to the health dept was in 2016. Please review notes and advise?

## 2016-08-06 NOTE — Telephone Encounter (Signed)
We had sent her to Dr. Marin Olp for her elevated WBC and he was also monitoring her iron and anemia. I would recommend that she schedule a follow up appointment with Dr. Marin Olp.

## 2016-08-06 NOTE — Telephone Encounter (Signed)
Pt states she went to the [facility] instead of infectious disease. I have called and let message on [employer] [phone]) voicemail to fax treatment information to [place]. Also wants to know what to do about her elevated WBCs and states that she thinks they have been elevated for about 1 year now. Please advise?

## 2016-08-06 NOTE — Telephone Encounter (Addendum)
Received fax from [facility] and it is dated 04/2015.  Looks like pt still needs repeat testing. Left message for pt to return my call to verify last time she was seen or had labs at the health dept.

## 2016-08-06 NOTE — Telephone Encounter (Signed)
Left detailed message on pt's cell# and to call if any questions. 

## 2016-08-07 NOTE — Telephone Encounter (Signed)
Notified pt and she voices understanding. 

## 2016-08-07 NOTE — Telephone Encounter (Signed)
Please let pt know that I reviewed their records.  Back in 9/16 they wanted to see her back in 3-6 months for follow up labs. I would recommend that she arrange a follow up visit with health department please.  [phone]. Thanks.

## 2016-08-31 ENCOUNTER — Encounter: Payer: Self-pay | Admitting: Family

## 2016-08-31 ENCOUNTER — Ambulatory Visit (INDEPENDENT_AMBULATORY_CARE_PROVIDER_SITE_OTHER): Payer: PRIVATE HEALTH INSURANCE | Admitting: Family

## 2016-08-31 VITALS — BP 148/100 | HR 91 | Temp 98.4°F | Resp 20 | Ht 62.0 in | Wt 208.0 lb

## 2016-08-31 DIAGNOSIS — Z0000 Encounter for general adult medical examination without abnormal findings: Secondary | ICD-10-CM | POA: Diagnosis not present

## 2016-08-31 LAB — BASIC METABOLIC PANEL
BUN: 17 mg/dL (ref 6–23)
CALCIUM: 9 mg/dL (ref 8.4–10.5)
CO2: 28 mEq/L (ref 19–32)
CREATININE: 0.95 mg/dL (ref 0.40–1.20)
Chloride: 105 mEq/L (ref 96–112)
GFR: 82.15 mL/min (ref >60.00)
Glucose, Bld: 75 mg/dL (ref 70–99)
Potassium: 3.8 mEq/L (ref 3.5–5.1)
Sodium: 139 mEq/L (ref 135–145)

## 2016-08-31 LAB — LIPID PANEL
CHOL/HDL RATIO: 3
Cholesterol: 172 mg/dL (ref 0–200)
HDL: 53.1 mg/dL (ref >39.00)
LDL Cholesterol: 105 mg/dL — ABNORMAL HIGH (ref 0–99)
NONHDL: 119.16
Triglycerides: 72 mg/dL (ref 0.0–149.0)
VLDL: 14.4 mg/dL (ref 0.0–40.0)

## 2016-08-31 LAB — TSH: TSH: 1.35 u[IU]/mL (ref 0.35–4.50)

## 2016-08-31 LAB — HEPATIC FUNCTION PANEL
ALK PHOS: 71 U/L (ref 39–117)
ALT: 11 U/L (ref 0–35)
AST: 11 U/L (ref 0–37)
Albumin: 4 g/dL (ref 3.5–5.2)
BILIRUBIN DIRECT: 0.1 mg/dL (ref 0.0–0.3)
TOTAL PROTEIN: 7.5 g/dL (ref 6.0–8.3)
Total Bilirubin: 0.3 mg/dL (ref 0.2–1.2)

## 2016-08-31 LAB — URINALYSIS, ROUTINE W REFLEX MICROSCOPIC
Bilirubin Urine: NEGATIVE
Hgb urine dipstick: NEGATIVE
Ketones, ur: NEGATIVE
Leukocytes, UA: NEGATIVE
Nitrite: NEGATIVE
PH: 6 (ref 5.0–8.0)
SPECIFIC GRAVITY, URINE: 1.025 (ref 1.000–1.030)
Total Protein, Urine: NEGATIVE
UROBILINOGEN UA: 0.2 (ref 0.0–1.0)
Urine Glucose: NEGATIVE

## 2016-08-31 LAB — CBC WITH DIFFERENTIAL/PLATELET
BASOS ABS: 0 10*3/uL (ref 0.0–0.1)
Basophils Relative: 0.1 % (ref 0.0–3.0)
EOS ABS: 0.2 10*3/uL (ref 0.0–0.7)
Eosinophils Relative: 1.5 % (ref 0.0–5.0)
HEMATOCRIT: 41.3 % (ref 36.0–46.0)
HEMOGLOBIN: 13.9 g/dL (ref 12.0–15.0)
LYMPHS PCT: 13.9 % (ref 12.0–46.0)
Lymphs Abs: 1.7 10*3/uL (ref 0.7–4.0)
MCHC: 33.6 g/dL (ref 30.0–36.0)
MCV: 88.3 fl (ref 78.0–100.0)
Monocytes Absolute: 0.8 10*3/uL (ref 0.1–1.0)
Monocytes Relative: 7 % (ref 3.0–12.0)
NEUTROS ABS: 9.3 10*3/uL — AB (ref 1.4–7.7)
Neutrophils Relative %: 77.5 % — ABNORMAL HIGH (ref 43.0–77.0)
PLATELETS: 271 10*3/uL (ref 150.0–400.0)
RBC: 4.67 Mil/uL (ref 3.87–5.11)
RDW: 14.2 % (ref 11.5–15.5)
WBC: 12 10*3/uL — AB (ref 4.0–10.5)

## 2016-08-31 MED ORDER — VALSARTAN-HYDROCHLOROTHIAZIDE 80-12.5 MG PO TABS: 1.0000 | ORAL_TABLET | Freq: Every day | ORAL | 5 refills | Status: DC

## 2016-08-31 NOTE — Progress Notes (Signed)
Pre visit review using our clinic review tool, if applicable. No additional management support is needed unless otherwise documented below in the visit note. 

## 2016-08-31 NOTE — Progress Notes (Signed)
Subjective:    Patient ID: Kristin Herrera, female    DOB: [birth year], 46 y.o.   MRN: [id number]  HPI   Kristin Herrera is a 46 yr old female who presents today for cpx.  Immunizations:  Tetanus and flu are up to date  Diet:trying to work on a healthy diet Exercise: tries to stay active but not exercising formally Wt Readings from Last 3 Encounters:  08/31/16 208 lb (94.3 kg)  07/30/16 206 lb (93.4 kg)  04/03/16 205 lb (93 kg)  Pap Smear: hysterectomy Mammogram: due in 3/18  Review of Systems  Constitutional: Negative for unexpected weight change.  HENT: Negative for hearing loss.        Mild nasal congestion due to uri  Eyes: Negative for visual disturbance.  Respiratory: Negative for cough and shortness of breath.   Cardiovascular: Negative for chest pain.  Gastrointestinal: Negative for blood in stool, constipation, diarrhea and nausea.  Genitourinary: Negative for dysuria, frequency and hematuria.  Musculoskeletal: Negative for arthralgias and myalgias.  Skin: Negative for rash.  Neurological: Negative for headaches.  Hematological: Negative for adenopathy.  Psychiatric/Behavioral:       Denies depression/anxiety    Past Medical History:  Diagnosis Date  . Diverticulitis   . Frequent episodic tension-type headache   . Gestational diabetes   . Hypertension      Social History   Social History  . Marital status: Married    Spouse name: N/A  . Number of children: 2  . Years of education: N/A   Occupational History  . 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 Smoking status: Former Smoker    Years: 10.00    Types: Cigarettes    Quit date: 03/10/2010  . Smokeless tobacco: Never Used  . Alcohol use No  . Drug use: No  . Sexual activity: Yes    Birth control/ protection: Surgical   Other Topics Concern  . Not on file   Social History Narrative   Caffeine Use:  1 daily   Regular exercise:  No   Married   2 children Son age  90 and daughter age 68   Works at [employer] as a Nurse, learning disability.   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     Complete 12 th grade                Past Surgical History:  Procedure Laterality Date  . ABDOMINAL HYSTERECTOMY  09/2015  . [place]  . ROBOTIC ASSISTED TOTAL HYSTERECTOMY WITH SALPINGECTOMY Bilateral 09/29/2015   Procedure: ROBOTIC ASSISTED TOTAL HYSTERECTOMY WITH SALPINGECTOMY;  Surgeon: Princess Bruins, MD;  Location: [facility];  Service: Gynecology;  Laterality: Bilateral;  . TUBAL LIGATION  1999    Family History  Problem Relation Age of Onset  . Hypertension Mother   . Heart disease Father   . Heart disease Paternal Uncle   . Heart disease Paternal Grandfather   . Hypertension Maternal Grandmother   . Diabetes Maternal Grandmother   . Stroke Paternal Grandmother   . Hypertension Paternal Grandmother   . Diabetes Paternal Grandmother   . Diabetes Daughter     Allergies  Allergen Reactions  . Simvastatin Other (See Comments)    cramping    Current Outpatient Prescriptions on File Prior to Visit  Medication Sig Dispense Refill  . valsartan-hydrochlorothiazide (DIOVAN-HCT) 80-12.5 MG tablet Take 1 tablet by mouth daily. 30 tablet 5   No current facility-administered medications on file prior  to visit.     Pulse 91   Temp 98.4 F (36.9 C) (Oral)   Resp 20   Ht 5\' 2"  (1.575 m)   Wt 208 lb (94.3 kg)   LMP 09/08/2015   SpO2 98% Comment: room air  BMI 38.04 kg/m       Objective:   Physical Exam  Physical Exam  Constitutional: She is oriented to person, place, and time. She appears well-developed and well-nourished. No distress.  HENT:  Head: Normocephalic and atraumatic.  Right Ear: Tympanic membrane and ear canal normal.  Left Ear: Tympanic membrane and ear canal normal.  Mouth/Throat: Oropharynx is clear and moist.  Eyes: Pupils are equal, round, and reactive to light. No scleral icterus.  Neck: Normal range of motion. No  thyromegaly present.  Cardiovascular: Normal rate and regular rhythm.   No murmur heard. Pulmonary/Chest: Effort normal and breath sounds normal. No respiratory distress. He has no wheezes. She has no rales. She exhibits no tenderness.  Abdominal: Soft. Bowel sounds are normal. She exhibits no distension and no mass. There is no tenderness. There is no rebound and no guarding.  Musculoskeletal: She exhibits no edema.  Lymphadenopathy:    She has no cervical adenopathy.  Neurological: She is alert and oriented to person, place, and time. She has normal patellar reflexes. She exhibits normal muscle tone. Coordination normal.  Skin: Skin is warm and dry.  Psychiatric: She has a normal mood and affect. Her behavior is normal. Judgment and thought content normal.  Breasts: Examined lying Right: Without masses, retractions, discharge or axillary adenopathy.  Left: Without masses, retractions, discharge or axillary adenopathy.  Pelvic: deferred     Assessment & Plan:         Assessment & Plan:  Preventative Care- discussed healthy diet, exercise and weight loss. Pt to schedule mammogram on the first floor.  Immunizations reviewed and up to date. EKG tracing is personally reviewed.  EKG notes NSR.  No acute changes.

## 2016-08-31 NOTE — Patient Instructions (Addendum)
Please complete lab work prior to leaving.  Restart your blood pressure medication.  Continue to work on [employer], exercise and weight loss. Please schedule your mammogram on the first floor for March.

## 2016-09-01 ENCOUNTER — Encounter: Payer: Self-pay | Admitting: Family

## 2016-09-05 ENCOUNTER — Encounter: Payer: Self-pay | Admitting: Infectious Disease

## 2016-09-05 ENCOUNTER — Ambulatory Visit (INDEPENDENT_AMBULATORY_CARE_PROVIDER_SITE_OTHER): Payer: PRIVATE HEALTH INSURANCE | Admitting: Infectious Disease

## 2016-09-05 ENCOUNTER — Other Ambulatory Visit (HOSPITAL_COMMUNITY)
Admission: RE | Admit: 2016-09-05 | Discharge: 2016-09-05 | Disposition: A | Discharge status: Home / Self Care | Payer: PRIVATE HEALTH INSURANCE | Source: Ambulatory Visit | Attending: Infectious Disease | Admitting: Infectious Disease

## 2016-09-05 VITALS — BP 135/94 | HR 83 | Temp 98.7°F | Ht 62.0 in | Wt 205.0 lb

## 2016-09-05 DIAGNOSIS — D72829 Elevated white blood cell count, unspecified: Secondary | ICD-10-CM | POA: Insufficient documentation

## 2016-09-05 DIAGNOSIS — Z8619 Personal history of other infectious and parasitic diseases: Secondary | ICD-10-CM | POA: Diagnosis not present

## 2016-09-05 DIAGNOSIS — Z113 Encounter for screening for infections with a predominantly sexual mode of transmission: Secondary | ICD-10-CM | POA: Diagnosis not present

## 2016-09-05 DIAGNOSIS — D72820 Lymphocytosis (symptomatic): Secondary | ICD-10-CM

## 2016-09-05 HISTORY — DX: Elevated white blood cell count, unspecified: D72.829

## 2016-09-05 HISTORY — DX: Encounter for screening for infections with a predominantly sexual mode of transmission: Z11.3

## 2016-09-05 LAB — HIV ANTIBODY (ROUTINE TESTING W REFLEX): HIV 1&2 Ab, 4th Generation: NONREACTIVE

## 2016-09-05 NOTE — Progress Notes (Signed)
Reason for consult: Positive syphilis test  Requesting physician: Dr. Inda Castle    Subjective:    Patient ID: Kristin Herrera, female    DOB: 08/11/1971, 46 y.o.   MRN: [id number]  HPI  Mrs. 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 She was diagnosed and treated for syphilis when she was a teenager living in [place]. In 2016 she visited the emerge department [place] and had a syphilis test done which came back positive at a titer of 1-8. She states she was then seen in the [place] and retested for syphilis and the test was negative. She is coming to our clinic for clarification about whether or not she has any active syphilis at all. She did test negative for HIV when tested in the ER 2 years ago. She states her husband is tested negative for both syphilis and HIV 2 years ago.  She has had 4 lifetime sexual partners but in a monogamous relationship for the past 17 years with her husband. She has had a tattoo no history of intravenous drug use no history of other STI besides syphilis.   Review of Systems  Constitutional: Negative for chills and fever.  HENT: Negative for congestion and sore throat.   Eyes: Negative for photophobia.  Respiratory: Negative for cough, shortness of breath and wheezing.   Cardiovascular: Negative for chest pain, palpitations and leg swelling.  Gastrointestinal: Negative for abdominal pain, blood in stool, constipation, diarrhea, nausea and vomiting.  Genitourinary: Negative for dysuria, flank pain and hematuria.  Musculoskeletal: Negative for back pain and myalgias.  Skin: Negative for rash.  Neurological: Negative for dizziness, weakness and headaches.  Hematological: Does not bruise/bleed easily.  Psychiatric/Behavioral: Negative for suicidal ideas.      Objective:   Physical Exam  Constitutional: She is  oriented to person, place, and time. She appears well-developed and well-nourished. No distress.  HENT:  Head: Normocephalic and atraumatic.  Mouth/Throat: No oropharyngeal exudate.  Eyes: Conjunctivae and EOM are normal. No scleral icterus.  Neck: Normal range of motion. Neck supple.  Cardiovascular: Normal rate and regular rhythm.   Pulmonary/Chest: Effort normal. No respiratory distress. She has no wheezes.  Abdominal: She exhibits no distension.  Musculoskeletal: She exhibits no edema or tenderness.  Neurological: She is alert and oriented to person, place, and time. She exhibits normal muscle tone. Coordination normal.  Skin: Skin is warm and dry. No rash noted. She is not diaphoretic. No erythema. No pallor.  Psychiatric: She has a normal mood and affect. Her behavior is normal. Judgment and thought content normal.          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 If the titer comes back again elevated 1-8 or higher I will check with the state to see what her titers were when she was a teenager and will consider whether or not she needs treatment. Hopefully the titer is either an seronegative 1 or a low titer consistent with prior treatment.  STI screening: Will check for HIV as well as gonorrhea chlamydia.  History of tattoos will check appetite C antibody  Leukocytosis has been worked up by hematology she seems of had fairly stable chronic elevated white blood cell count without clear-cut evidence for infection.  I spent greater than 30 minutes with the patient including greater than 50% of time in face to  face counsel of the patient re her RPR test, her syphilis, STI screening leukocytosis and in coordination of her care.

## 2016-09-06 ENCOUNTER — Telehealth: Payer: Self-pay | Admitting: *Deleted

## 2016-09-06 LAB — HEPATITIS C ANTIBODY: HCV Ab: NEGATIVE

## 2016-09-06 LAB — URINE CYTOLOGY ANCILLARY ONLY
CHLAMYDIA, DNA PROBE: NEGATIVE
NEISSERIA GONORRHEA: NEGATIVE

## 2016-09-06 LAB — RPR: RPR: REACTIVE — AB

## 2016-09-06 LAB — RPR TITER

## 2016-09-06 NOTE — Telephone Encounter (Signed)
Relayed information and results to patient. She verbalized understanding. Landis Gandy, RN

## 2016-09-06 NOTE — Telephone Encounter (Signed)
-----   Message from Truman Hayward, MD sent at 09/06/2016  1:52 PM EST ----- Patients low titer RPR of 1:1 is NOT consistent with ACTIVE infection but rather with remote infection (ie her infection as 46 year old) The titer of 1:8 in 2016 could be lab error or variance but she DOES NOT need further workup or treatment

## 2016-09-06 NOTE — Telephone Encounter (Signed)
Perfect thanks so much Sharyn Lull

## 2016-09-06 NOTE — Telephone Encounter (Signed)
Called patient and left her a voice mail to call the clinic for her lab results. Kristin Herrera

## 2016-09-10 LAB — FLUORESCENT TREPONEMAL AB(FTA)-IGG-BLD: Fluorescent Treponemal ABS: REACTIVE — AB

## 2016-10-04 ENCOUNTER — Telehealth: Payer: Self-pay | Admitting: Family

## 2016-10-04 DIAGNOSIS — E538 Deficiency of other specified B group vitamins: Secondary | ICD-10-CM

## 2016-10-04 NOTE — Telephone Encounter (Signed)
Patient would like a call to discuss getting a prescription to get vit b12 injections. Please advise  Phone: [phone]

## 2016-10-05 NOTE — Telephone Encounter (Signed)
Lets check b12 level.  If low, we can restart b12 injections.

## 2016-10-05 NOTE — Telephone Encounter (Signed)
Left message for pt to return my call. I do not see that we have checked her B12 level in the last year.  If pt is concerned that b12 is low she will need office visit to discuss and obtain orders.

## 2016-10-05 NOTE — Telephone Encounter (Signed)
Spoke with pt. And she states that she was getting B12 injections through the bariatric clinic in 2014 and it seems like she had more energy at that time. Pt is requesting to restart injections through our office. I do not see that we have checked her B12 level in the last year. Please advise?

## 2016-10-05 NOTE — Telephone Encounter (Signed)
Notified pt and scheduled lab visit for 10/15/16 at 7:15am.  Future order entered.

## 2016-10-15 ENCOUNTER — Other Ambulatory Visit (INDEPENDENT_AMBULATORY_CARE_PROVIDER_SITE_OTHER): Payer: PRIVATE HEALTH INSURANCE

## 2016-10-15 DIAGNOSIS — E538 Deficiency of other specified B group vitamins: Secondary | ICD-10-CM

## 2016-10-15 LAB — VITAMIN B12: Vitamin B-12: 901 pg/mL (ref 211–911)

## 2016-12-03 ENCOUNTER — Ambulatory Visit: Payer: PRIVATE HEALTH INSURANCE | Admitting: Family

## 2016-12-18 ENCOUNTER — Other Ambulatory Visit: Payer: Self-pay | Admitting: Family

## 2016-12-18 DIAGNOSIS — Z1231 Encounter for screening mammogram for malignant neoplasm of breast: Secondary | ICD-10-CM

## 2016-12-19 ENCOUNTER — Telehealth: Payer: Self-pay | Admitting: Family

## 2016-12-19 DIAGNOSIS — Z1239 Encounter for other screening for malignant neoplasm of breast: Secondary | ICD-10-CM

## 2016-12-19 NOTE — Telephone Encounter (Signed)
Looks like appt is already scheduled for 12/25/16 at [facility].

## 2016-12-19 NOTE — Telephone Encounter (Signed)
Appointment Request From: Adelfa Koh Rico Junker    With Provider: Nance Pear., NP [place] [facility]    Preferred Date Range: Any date 12/26/2016 or later    Preferred Times: Any    Reason: To address the following health maintenance concerns.  Mammogram    Comments:

## 2016-12-25 ENCOUNTER — Ambulatory Visit (HOSPITAL_BASED_OUTPATIENT_CLINIC_OR_DEPARTMENT_OTHER)
Admission: RE | Admit: 2016-12-25 | Discharge: 2016-12-25 | Disposition: A | Discharge status: Home / Self Care | Payer: PRIVATE HEALTH INSURANCE | Source: Ambulatory Visit | Attending: Family | Admitting: Family

## 2016-12-25 ENCOUNTER — Encounter (HOSPITAL_BASED_OUTPATIENT_CLINIC_OR_DEPARTMENT_OTHER): Payer: Self-pay

## 2016-12-25 DIAGNOSIS — R921 Mammographic calcification found on diagnostic imaging of breast: Secondary | ICD-10-CM | POA: Diagnosis not present

## 2016-12-25 DIAGNOSIS — R928 Other abnormal and inconclusive findings on diagnostic imaging of breast: Secondary | ICD-10-CM | POA: Diagnosis not present

## 2016-12-25 DIAGNOSIS — Z1231 Encounter for screening mammogram for malignant neoplasm of breast: Secondary | ICD-10-CM

## 2016-12-25 DIAGNOSIS — N6489 Other specified disorders of breast: Secondary | ICD-10-CM | POA: Diagnosis not present

## 2016-12-26 ENCOUNTER — Other Ambulatory Visit: Payer: Self-pay | Admitting: Family

## 2016-12-26 DIAGNOSIS — N6489 Other specified disorders of breast: Secondary | ICD-10-CM

## 2016-12-26 DIAGNOSIS — R921 Mammographic calcification found on diagnostic imaging of breast: Secondary | ICD-10-CM

## 2016-12-26 DIAGNOSIS — R928 Other abnormal and inconclusive findings on diagnostic imaging of breast: Secondary | ICD-10-CM

## 2016-12-27 ENCOUNTER — Telehealth: Payer: Self-pay | Admitting: *Deleted

## 2016-12-27 NOTE — Telephone Encounter (Signed)
Received Physician Order from [facility], forwarded to provider/SLS 05/03  

## 2016-12-28 ENCOUNTER — Ambulatory Visit
Admission: RE | Admit: 2016-12-28 | Discharge: 2016-12-28 | Disposition: A | Discharge status: Home / Self Care | Payer: PRIVATE HEALTH INSURANCE | Source: Ambulatory Visit | Attending: Family | Admitting: Family

## 2016-12-28 DIAGNOSIS — N6489 Other specified disorders of breast: Secondary | ICD-10-CM

## 2016-12-28 DIAGNOSIS — R921 Mammographic calcification found on diagnostic imaging of breast: Secondary | ICD-10-CM

## 2017-02-26 ENCOUNTER — Emergency Department (HOSPITAL_BASED_OUTPATIENT_CLINIC_OR_DEPARTMENT_OTHER): Payer: PRIVATE HEALTH INSURANCE

## 2017-02-26 ENCOUNTER — Emergency Department (HOSPITAL_BASED_OUTPATIENT_CLINIC_OR_DEPARTMENT_OTHER)
Admission: EM | Admit: 2017-02-26 | Discharge: 2017-02-26 | Disposition: A | Discharge status: Home / Self Care | Payer: PRIVATE HEALTH INSURANCE | Attending: Emergency Medicine | Admitting: Emergency Medicine

## 2017-02-26 ENCOUNTER — Encounter (HOSPITAL_BASED_OUTPATIENT_CLINIC_OR_DEPARTMENT_OTHER): Payer: Self-pay | Admitting: Emergency Medicine

## 2017-02-26 DIAGNOSIS — Z87891 Personal history of nicotine dependence: Secondary | ICD-10-CM | POA: Insufficient documentation

## 2017-02-26 DIAGNOSIS — I10 Essential (primary) hypertension: Secondary | ICD-10-CM | POA: Insufficient documentation

## 2017-02-26 DIAGNOSIS — Z79899 Other long term (current) drug therapy: Secondary | ICD-10-CM | POA: Insufficient documentation

## 2017-02-26 DIAGNOSIS — R1011 Right upper quadrant pain: Secondary | ICD-10-CM

## 2017-02-26 DIAGNOSIS — R109 Unspecified abdominal pain: Secondary | ICD-10-CM

## 2017-02-26 DIAGNOSIS — R1031 Right lower quadrant pain: Secondary | ICD-10-CM | POA: Insufficient documentation

## 2017-02-26 LAB — CBC WITH DIFFERENTIAL/PLATELET
Basophils Absolute: 0 10*3/uL (ref 0.0–0.1)
Basophils Relative: 0 %
Eosinophils Absolute: 0.1 10*3/uL (ref 0.0–0.7)
Eosinophils Relative: 1 %
HCT: 42 % (ref 36.0–46.0)
Hemoglobin: 14.2 g/dL (ref 12.0–15.0)
Lymphocytes Relative: 7 %
Lymphs Abs: 0.8 10*3/uL (ref 0.7–4.0)
MCH: 30.3 pg (ref 26.0–34.0)
MCHC: 33.8 g/dL (ref 30.0–36.0)
MCV: 89.7 fL (ref 78.0–100.0)
Monocytes Absolute: 0.6 10*3/uL (ref 0.1–1.0)
Monocytes Relative: 5 %
Neutro Abs: 10.3 10*3/uL — ABNORMAL HIGH (ref 1.7–7.7)
Neutrophils Relative %: 87 %
Platelets: 226 10*3/uL (ref 150–400)
RBC: 4.68 MIL/uL (ref 3.87–5.11)
RDW: 13.7 % (ref 11.5–15.5)
WBC: 11.8 10*3/uL — ABNORMAL HIGH (ref 4.0–10.5)

## 2017-02-26 LAB — URINALYSIS, ROUTINE W REFLEX MICROSCOPIC
Bilirubin Urine: NEGATIVE
GLUCOSE, UA: NEGATIVE mg/dL
HGB URINE DIPSTICK: NEGATIVE
Ketones, ur: NEGATIVE mg/dL
Leukocytes, UA: NEGATIVE
Nitrite: NEGATIVE
PROTEIN: NEGATIVE mg/dL
SPECIFIC GRAVITY, URINE: 1.025 (ref 1.005–1.030)
pH: 7.5 (ref 5.0–8.0)

## 2017-02-26 LAB — LIPASE, BLOOD: Lipase: 28 U/L (ref 11–51)

## 2017-02-26 LAB — COMPREHENSIVE METABOLIC PANEL
ALT: 16 U/L (ref 14–54)
AST: 19 U/L (ref 15–41)
Albumin: 3.8 g/dL (ref 3.5–5.0)
Alkaline Phosphatase: 69 U/L (ref 38–126)
Anion gap: 9 (ref 5–15)
BUN: 15 mg/dL (ref 6–20)
CO2: 25 mmol/L (ref 22–32)
Calcium: 8.8 mg/dL — ABNORMAL LOW (ref 8.9–10.3)
Chloride: 104 mmol/L (ref 101–111)
Creatinine, Ser: 0.71 mg/dL (ref 0.44–1.00)
GFR calc Af Amer: >60 mL/min (ref >60)
GFR calc non Af Amer: >60 mL/min (ref >60)
Glucose, Bld: 89 mg/dL (ref 65–99)
Potassium: 3.6 mmol/L (ref 3.5–5.1)
Sodium: 138 mmol/L (ref 135–145)
Total Bilirubin: 0.8 mg/dL (ref 0.3–1.2)
Total Protein: 7.4 g/dL (ref 6.5–8.1)

## 2017-02-26 MED ORDER — KETOROLAC TROMETHAMINE 30 MG/ML IJ SOLN
30.0000 mg | Freq: Once | INTRAMUSCULAR | Status: AC
  Administered 2017-02-26: 30 mg via INTRAVENOUS
  Filled 2017-02-26: qty 1

## 2017-02-26 MED ORDER — ONDANSETRON HCL 4 MG PO TABS: 4.0000 mg | ORAL_TABLET | Freq: Four times a day (QID) | ORAL | 0 refills | Status: DC

## 2017-02-26 MED ORDER — SODIUM CHLORIDE 0.9 % IV BOLUS (SEPSIS)
1000.0000 mL | Freq: Once | INTRAVENOUS | Status: AC
  Administered 2017-02-26: 1000 mL via INTRAVENOUS

## 2017-02-26 NOTE — Discharge Instructions (Signed)
Medications: Zofran  Treatment: Take Zofran every 6 hours as needed for nausea or vomiting. Take ibuprofen as prescribed over-the-counter as needed for your pain. Make sure to stay hydrated.  Follow-up: Please follow-up with your primary care provider tomorrow for further evaluation and treatment. Please return to emergency department immediately if you develop any fevers or 100.4, worsening severe abdominal pain, intractable vomiting, or any other new or concerning symptoms.

## 2017-02-26 NOTE — ED Notes (Signed)
Patient transported to Ultrasound 

## 2017-02-26 NOTE — ED Triage Notes (Signed)
Patient states that she had had intermittent right flank pain with N/D starting today

## 2017-02-26 NOTE — ED Notes (Signed)
ED Provider at bedside. 

## 2017-02-26 NOTE — ED Provider Notes (Signed)
[facility] Provider Note   CSN: [id number] Arrival date & time: 02/26/17  1305     History   Chief Complaint Chief Complaint  Patient presents with  . Flank Pain    HPI Kristin Herrera is a 46 y.o. female with history of hypertension, diverticulitis who presents with upper abdominal pain and right flank pain that began 3 days ago. Patient has also had associated nausea and loose stools. She denies blood in her stools. Her pain is intermittent. She describes it as aching, sharp sensation. It is not associated with food. She denies any fevers. Patient reports history of laparoscopic partial hysterectomy, however no other abdominal surgeries. Patient has taken ibuprofen for her symptoms with good relief. She denies any chest pain, shortness of breath, vomiting, urinary symptoms, or vaginal symptoms. No hx of kidney stones.  HPI  Past Medical History:  Diagnosis Date  . Diverticulitis   . Frequent episodic tension-type headache   . Gestational diabetes   . Hypertension   . Leukocytosis 09/05/2016  . Routine screening for STI (sexually transmitted infection) 09/05/2016    Patient Active Problem List   Diagnosis Date Noted  . Leukocytosis 09/05/2016  . Routine screening for STI (sexually transmitted infection) 09/05/2016  . Preventative health care 08/02/2015  . Anemia, iron deficiency 04/11/2015  . History of syphilis 04/11/2015  . Hyperlipemia 10/19/2013  . Pain in joint, shoulder region 09/21/2013  . Menorrhagia 09/03/2012  . Knee pain 09/04/2011  . Borderline diabetes 06/11/2011  . General medical examination 06/04/2011  . Thyromegaly 06/04/2011  . HTN (hypertension) 05/04/2011    Past Surgical History:  Procedure Laterality Date  . ABDOMINAL HYSTERECTOMY  09/2015  . [place]  . ROBOTIC ASSISTED TOTAL HYSTERECTOMY WITH SALPINGECTOMY Bilateral 09/29/2015   Procedure: ROBOTIC ASSISTED TOTAL HYSTERECTOMY WITH SALPINGECTOMY;  Surgeon:  Princess Bruins, MD;  Location: [facility];  Service: Gynecology;  Laterality: Bilateral;  . TUBAL LIGATION  1999    OB History    No data available       Home Medications    Prior to Admission medications   Medication Sig Start Date End Date Taking? Authorizing Provider  ondansetron (ZOFRAN) 4 MG tablet Take 1 tablet (4 mg total) by mouth every 6 (six) hours. 02/26/17   Demetric Dunnaway, Bea Graff, PA-C  valsartan-hydrochlorothiazide (DIOVAN-HCT) 80-12.5 MG tablet Take 1 tablet by mouth daily. 08/31/16   Debbrah Alar, NP    Family History Family History  Problem Relation Age of Onset  . Hypertension Mother   . Heart disease Father        CAD, died from MI at age 11  . Heart disease Paternal Uncle   . Heart disease Paternal Grandfather   . Hypertension Maternal Grandmother   . Diabetes Maternal Grandmother   . Stroke Paternal Grandmother   . Hypertension Paternal Grandmother   . Diabetes Paternal Grandmother   . Diabetes Daughter     Social History Social History  Substance Use Topics  . Smoking status: Former Smoker    Years: 10.00    Types: Cigarettes    Quit date: 03/10/2010  . Smokeless tobacco: Never Used  . Alcohol use No     Allergies   Simvastatin   Review of Systems Review of Systems  Constitutional: Negative for chills and fever.  HENT: Negative for facial swelling and sore throat.   Respiratory: Negative for shortness of breath.   Cardiovascular: Negative for chest pain.  Gastrointestinal: Positive for abdominal pain, diarrhea  and nausea. Negative for blood in stool and vomiting.  Genitourinary: Positive for flank pain. Negative for dysuria, frequency, pelvic pain, urgency, vaginal bleeding, vaginal discharge and vaginal pain.  Musculoskeletal: Negative for back pain.  Skin: Negative for rash and wound.  Neurological: Negative for headaches.  Psychiatric/Behavioral: The patient is not nervous/anxious.      Physical Exam Updated Vital Signs BP 130/85    Pulse 91   Temp 98.7 F (37.1 C) (Oral)   Resp 20   Ht 5\' 2"  (1.575 m)   Wt 95.3 kg (210 lb)   LMP 09/08/2015   SpO2 97%   BMI 38.41 kg/m   Physical Exam  Constitutional: She appears well-developed and well-nourished. No distress.  HENT:  Head: Normocephalic and atraumatic.  Mouth/Throat: Oropharynx is clear and moist. No oropharyngeal exudate.  Eyes: Conjunctivae are normal. Pupils are equal, round, and reactive to light. Right eye exhibits no discharge. Left eye exhibits no discharge. No scleral icterus.  Neck: Normal range of motion. Neck supple. No thyromegaly present.  Cardiovascular: Normal rate, regular rhythm, normal heart sounds and intact distal pulses.  Exam reveals no gallop and no friction rub.   No murmur heard. Pulmonary/Chest: Effort normal and breath sounds normal. No stridor. No respiratory distress. She has no wheezes. She has no rales.  Abdominal: Soft. Bowel sounds are normal. She exhibits no distension. There is tenderness in the right upper quadrant, epigastric area and left upper quadrant. There is no rebound, no guarding, no CVA tenderness, no tenderness at McBurney's point and negative Murphy's sign.  Musculoskeletal: She exhibits no edema.       Back:  Lymphadenopathy:    She has no cervical adenopathy.  Neurological: She is alert. Coordination normal.  Skin: Skin is warm and dry. No rash noted. She is not diaphoretic. No pallor.  Psychiatric: She has a normal mood and affect.  Nursing note and vitals reviewed.    ED Treatments / Results  Labs (all labs ordered are listed, but only abnormal results are displayed) Labs Reviewed  COMPREHENSIVE METABOLIC PANEL - Abnormal; Notable for the following:       Result Value   Calcium 8.8 (*)    All other components within normal limits  CBC WITH DIFFERENTIAL/PLATELET - Abnormal; Notable for the following:    WBC 11.8 (*)    Neutro Abs 10.3 (*)    All other components within normal limits  URINALYSIS,  ROUTINE W REFLEX MICROSCOPIC - Abnormal; Notable for the following:    Color, Urine AMBER (*)    All other components within normal limits  LIPASE, BLOOD    EKG  EKG Interpretation None       Radiology US Abdomen Limited Ruq  Result Date: 02/26/2017 CLINICAL DATA:  RUQ pain / nausea/ diarrhea x 3 days EXAM: ULTRASOUND ABDOMEN LIMITED RIGHT UPPER QUADRANT COMPARISON:  None. FINDINGS: Gallbladder: No gallstones or wall thickening visualized. No sonographic Murphy sign noted by sonographer. Common bile duct: Diameter: Normal at 3 mm Liver: Mild increased echogenicity. No focal lesion. No biliary duct dilatation. IMPRESSION: 1. Normal gallbladder. 2. Mild increase in liver echogenicity commonly indicates hepatic steatosis. Electronically Signed   By: Suzy Bouchard M.D.   On: 02/26/2017 15:28    Procedures Procedures (including critical care time)  Medications Ordered in ED Medications  sodium chloride 0.9 % bolus 1,000 mL (0 mLs Intravenous Stopped 02/26/17 1537)  ketorolac (TORADOL) 30 MG/ML injection 30 mg (30 mg Intravenous Given 02/26/17 1405)     Initial  Impression / Assessment and Plan / ED Course  I have reviewed the triage vital signs and the nursing notes.  Pertinent labs & imaging results that were available during my care of the patient were reviewed by me and considered in my medical decision making (see chart for details).     Patient with right flank pain and upper abdominal pain improved immediately with Zofran and ibuprofen. CBC shows WBC 11.8, the patient has chronically elevated WBC. CMP unremarkable. UA is negative. Lipase WNL. Right upper quadrant ultrasound shows normal gallbladder and mild increase in liver echogenicity calmly indicating hepatic steatosis. I discussed with shared decision-making the patient and she would like to follow up with her primary care provider for evaluation of CT scan for further evaluation. I feel this is reasonable as patient has stable  vitals and has improved symptoms in the ED with treatment. I'll discharge patient home with Zofran. She is advised to take ibuprofen as prescribed over-the-counter as needed for pain. Strict return precautions discussed. Patient understands and agrees with plan. Patient vitals stable throughout ED course discharged in satisfactory condition. I discussed patient case with Dr. Alvino Chapel who guided the patient's management and agrees with plan.   Final Clinical Impressions(s) / ED Diagnoses   Final diagnoses:  RUQ abdominal pain  Right flank pain    New Prescriptions Discharge Medication List as of 02/26/2017  4:35 PM    START taking these medications   Details  ondansetron (ZOFRAN) 4 MG tablet Take 1 tablet (4 mg total) by mouth every 6 (six) hours., Starting Tue 02/26/2017, Print         Herbert Aguinaldo, [place], PA-C 02/26/17 1754    Davonna Belling, MD 02/27/17 [phone]

## 2017-03-07 ENCOUNTER — Telehealth: Payer: Self-pay | Admitting: Family

## 2017-03-07 NOTE — Telephone Encounter (Signed)
Signed.

## 2017-03-07 NOTE — Telephone Encounter (Signed)
Pt dropped off a physicians statement for Melissa to sign, pt will pick up when ready, documents placed in tray at front office

## 2017-03-07 NOTE — Telephone Encounter (Signed)
Form completed and faxed to below #.

## 2017-03-07 NOTE — Telephone Encounter (Signed)
Pt called stating employer is wanting from by Tuesday and she would like [place] to fax it when completed to [phone], Attn: Hinton Dyer.  Form placed in PCP red folder for signature.

## 2017-06-26 ENCOUNTER — Other Ambulatory Visit: Payer: Self-pay | Admitting: Family

## 2017-06-26 DIAGNOSIS — R921 Mammographic calcification found on diagnostic imaging of breast: Secondary | ICD-10-CM

## 2017-07-02 ENCOUNTER — Ambulatory Visit
Admission: RE | Admit: 2017-07-02 | Discharge: 2017-07-02 | Disposition: A | Discharge status: Home / Self Care | Payer: Self-pay | Source: Ambulatory Visit | Attending: Family | Admitting: Family

## 2017-07-02 ENCOUNTER — Other Ambulatory Visit: Payer: Self-pay | Admitting: Family

## 2017-07-02 DIAGNOSIS — R921 Mammographic calcification found on diagnostic imaging of breast: Secondary | ICD-10-CM

## 2017-08-02 ENCOUNTER — Telehealth: Payer: Self-pay | Admitting: Family

## 2017-08-02 NOTE — Telephone Encounter (Signed)
Copied from [place] [phone]. Topic: Quick Communication - Rx Refill/Question >> Aug 02, 2017 12:23 PM Arletha Grippe wrote: Has the patient contacted their pharmacy? Yes.     (Agent: If no, request that the patient contact the pharmacy for the refill.)   Preferred Pharmacy (with phone number or street name): pt would like to be changed off of losartan, hers is not part of the recall, but she would like to change anyway. [facility] [address] Pt phone number is [phone]    Agent: Please be advised that RX refills may take up to 3 business days. We ask that you follow-up with your pharmacy.

## 2017-08-06 NOTE — Telephone Encounter (Signed)
Left message for pt to call office back regarding why she wanted to be taken off medication. Chart showing she is on Valsartan and not Losartan.

## 2017-08-07 ENCOUNTER — Telehealth: Payer: Self-pay | Admitting: Family

## 2017-08-07 NOTE — Telephone Encounter (Signed)
Copied from [place] [phone]. Topic: Quick Communication - Office Called Patient >> Aug 07, 2017  4:06 PM Cecelia Byars, NT wrote: Reason for [id number] returning call from practice please call back [phone] would prefer to speak with [facility]

## 2017-08-08 NOTE — Telephone Encounter (Signed)
Attempted to reach pt and left message to check mychart acct. Message sent via mychart.

## 2017-08-09 MED ORDER — VALSARTAN-HYDROCHLOROTHIAZIDE 80-12.5 MG PO TABS: 1.0000 | ORAL_TABLET | Freq: Every day | ORAL | 1 refills | Status: DC

## 2017-08-09 NOTE — Telephone Encounter (Signed)
Confirmed with the med center pharmacy.  Her medication was not recalled.  She [facility] [address]  I have advised her that she is due for follow-up and to schedule follow-up at her earliest convenience.  Patient verbalizes understanding.

## 2017-09-26 ENCOUNTER — Ambulatory Visit: Payer: Self-pay

## 2017-09-26 ENCOUNTER — Ambulatory Visit: Payer: BLUE CROSS/BLUE SHIELD | Admitting: Family Medicine

## 2017-09-26 ENCOUNTER — Encounter: Payer: Self-pay | Admitting: Family Medicine

## 2017-09-26 ENCOUNTER — Encounter: Payer: Self-pay | Admitting: Family

## 2017-09-26 VITALS — BP 140/100 | HR 90 | Temp 98.7°F | Ht 62.0 in | Wt 195.2 lb

## 2017-09-26 DIAGNOSIS — I10 Essential (primary) hypertension: Secondary | ICD-10-CM

## 2017-09-26 LAB — BASIC METABOLIC PANEL
BUN: 18 mg/dL (ref 6–23)
CALCIUM: 9.8 mg/dL (ref 8.4–10.5)
CO2: 30 mEq/L (ref 19–32)
CREATININE: 1.07 mg/dL (ref 0.40–1.20)
Chloride: 101 mEq/L (ref 96–112)
GFR: 71.27 mL/min (ref >60.00)
GLUCOSE: 106 mg/dL — AB (ref 70–99)
Potassium: 4.4 mEq/L (ref 3.5–5.1)
Sodium: 138 mEq/L (ref 135–145)

## 2017-09-26 LAB — TSH: TSH: 2.14 u[IU]/mL (ref 0.35–4.50)

## 2017-09-26 MED ORDER — CHLORTHALIDONE 25 MG PO TABS: 25.0000 mg | ORAL_TABLET | Freq: Every day | ORAL | 2 refills | Status: DC

## 2017-09-26 MED ORDER — LISINOPRIL 20 MG PO TABS
20.0000 mg | ORAL_TABLET | Freq: Every day | ORAL | 2 refills | Status: DC
Start: 2017-09-26 — End: 2018-12-12

## 2017-09-26 NOTE — Telephone Encounter (Signed)
Appt w/ Dr. Wendling today.  

## 2017-09-26 NOTE — Progress Notes (Signed)
Chief Complaint  Patient presents with  . Hypertension    Subjective Kristin Herrera is a 47 y.o. female who presents for hypertension follow up. She does monitor home blood pressures. Blood pressures ranging from 160-180's/90-100's on average over past several weeks. She is compliant with medications- Diovan 80-12.5 mg/d, has been taking BID without improvement. Patient has these side effects of medication: none She is adhering to a healthy diet overall. Current exercise: joined [employer]am[employer] of HTN.    Past Medical History:  Diagnosis Date  . Diverticulitis   . Frequent episodic tension-type headache   . Gestational diabetes   . Hypertension   . Leukocytosis 09/05/2016  . Routine screening for STI (sexually transmitted infection) 09/05/2016   Allergies Allergies  Allergen Reactions  . Simvastatin Other (See Comments)    cramping    Review of Systems Cardiovascular: no chest pain Respiratory:  no shortness of breath  Exam BP (!) 140/100 (BP Location: Left Arm, Patient Position: Sitting, Cuff Size: Normal)   Pulse 90   Temp 98.7 F (37.1 C) (Oral)   Ht 5\' 2"  (1.575 m)   Wt 195 lb 4 oz (88.6 kg)   LMP 09/08/2015   SpO2 97%   BMI 35.71 kg/m  General:  well developed, well nourished, in no apparent distress Skin: warm, no pallor or diaphoresis Eyes: pupils equal and round, sclera anicteric without injection Heart: RRR, no bruits, no LE edema Lungs: clear to auscultation, no accessory muscle use Psych: well oriented with normal range of affect and appropriate judgment/insight  Essential hypertension - Plan: lisinopril (PRINIVIL,ZESTRIL) 20 MG tablet, chlorthalidone (HYGROTON) 25 MG tablet, Basic metabolic panel, TSH, Basic metabolic panel  Orders as above. Ck labs to r/o metabolic issue.  Change Diovan to lisinopril and chlorthalidone (worried about ARBs and recent contamination). Counseled on diet and exercise F/u in 1 week for labs, 2-3 weeks with appt  with Melissa. She will hopefully increase dose of chlorthalidone (she will take 1/2 tab daily for first week) if labs look ok at 1 week. The patient voiced understanding and agreement to the plan.  [place], DO 09/26/17  10:32 AM

## 2017-09-26 NOTE — Progress Notes (Signed)
Pre visit review using our clinic review tool, if applicable. No additional management support is needed unless otherwise documented below in the visit note. 

## 2017-09-26 NOTE — Patient Instructions (Signed)
Keep checking BP.  Keep the diet clean.   Ok to exercise if you are not having excessive shortness of breath or chest pain.  Let us know if you need anything.

## 2017-09-26 NOTE — Telephone Encounter (Signed)
  Reason for Disposition . Systolic BP  >= 161 OR Diastolic >= 096  Answer Assessment - Initial Assessment Questions 1. BLOOD PRESSURE: "What is the blood pressure?" "Did you take at least two measurements 5 minutes apart?"     138/99 2. ONSET: "When did you take your blood pressure?"     Yesterdat 3. HOW: "How did you obtain the blood pressure?" (e.g., visiting nurse, automatic home BP monitor)     Home blood pressure 4. HISTORY: "Do you have a history of high blood pressure?"     Yes 5. MEDICATIONS: "Are you taking any medications for blood pressure?" "Have you missed any doses recently?"     No missed doses 6. OTHER SYMPTOMS: "Do you have any symptoms?" (e.g., headache, chest pain, blurred vision, difficulty breathing, weakness)     Headache, dizzy 7. PREGNANCY: "Is there any chance you are pregnant?" "When was your last menstrual period?"     No  Protocols used: HIGH BLOOD PRESSURE-A-AH Pt. Concerned about BP, states "it keeps increasing".

## 2017-09-27 ENCOUNTER — Telehealth: Payer: Self-pay | Admitting: Family

## 2017-09-27 ENCOUNTER — Ambulatory Visit: Payer: Self-pay | Admitting: Family

## 2017-09-27 NOTE — Telephone Encounter (Signed)
Spoke with pharmacist and advised him per patient instructions at 09/26/17 visit, pt should take 1/2 tablet daily x 1 week then increase to 1 tablet daily after repeating BP and labs in 1 week. Notified pt that pharmacy is aware and will fill Rx, pt voices understanding of medication directions as well.

## 2017-09-27 NOTE — Telephone Encounter (Signed)
Copied from [place]. Topic: Quick Communication - See Telephone Encounter >> Sep 27, 2017  8:52 AM Synthia Innocent wrote: CRM for notification. See Telephone encounter for: Pharmacy needs clarification on dosage of chlorthalidone (HYGROTON) 25 MG tablet, [facility] [address]   09/27/17.

## 2017-09-27 NOTE — Telephone Encounter (Signed)
Caller name: Kristin Herrera  Relationship to patient: self Can be reached: [phone] Pharmacy:  [id number] [place], [place] [address] [phone] (Phone) [phone] (Fax)     Reason for call: pt calling to f/u as pharmacy told her they have not received clarification.  Pt states chlorthalidone was prescribed by Dr. Nani Ravens. Please advise pt.

## 2017-10-03 ENCOUNTER — Other Ambulatory Visit: Payer: BLUE CROSS/BLUE SHIELD

## 2017-10-11 ENCOUNTER — Ambulatory Visit: Payer: BLUE CROSS/BLUE SHIELD | Admitting: Family

## 2017-10-11 DIAGNOSIS — Z0289 Encounter for other administrative examinations: Secondary | ICD-10-CM

## 2017-10-15 ENCOUNTER — Encounter: Payer: Self-pay | Admitting: Family

## 2018-01-30 ENCOUNTER — Ambulatory Visit
Admission: RE | Admit: 2018-01-30 | Discharge: 2018-01-30 | Disposition: A | Discharge status: Home / Self Care | Payer: Self-pay | Source: Ambulatory Visit | Attending: Family | Admitting: Family

## 2018-01-30 DIAGNOSIS — R921 Mammographic calcification found on diagnostic imaging of breast: Secondary | ICD-10-CM

## 2018-12-12 ENCOUNTER — Encounter: Payer: Self-pay | Admitting: Family

## 2018-12-12 ENCOUNTER — Ambulatory Visit (INDEPENDENT_AMBULATORY_CARE_PROVIDER_SITE_OTHER): Payer: PRIVATE HEALTH INSURANCE | Admitting: Family

## 2018-12-12 ENCOUNTER — Other Ambulatory Visit: Payer: Self-pay

## 2018-12-12 DIAGNOSIS — I10 Essential (primary) hypertension: Secondary | ICD-10-CM | POA: Diagnosis not present

## 2018-12-12 DIAGNOSIS — R739 Hyperglycemia, unspecified: Secondary | ICD-10-CM | POA: Diagnosis not present

## 2018-12-12 MED ORDER — METOPROLOL SUCCINATE ER 50 MG PO TB24: 50.0000 mg | ORAL_TABLET | Freq: Every day | ORAL | 3 refills | Status: DC

## 2018-12-12 NOTE — Progress Notes (Addendum)
Virtual Visit via Video Note  I connected with Ms. Rhodes on 12/12/18 at  8:40 AM EDT by a video enabled telemedicine application and verified that I am speaking with the correct person using two identifiers. This visit type was conducted due to national recommendations for restrictions regarding the COVID-19 Pandemic (e.g. social distancing).  This format is felt to be most appropriate for this patient at this time.   I discussed the limitations of evaluation and management by telemedicine and the availability of in person appointments. The patient expressed understanding and agreed to proceed.  Only the patient and myself were on today's video visit. The patient was at home and I was at home at the time of today's visit.   History of Present Illness:  HTN- Reports that she checked her bp last week was 159/92.  Reports that she has been out of medication x 2 months. Reports slight headache. Denies LE edema, sob or chest pain. BP Readings from Last 3 Encounters:  09/26/17 (!) 140/100  02/26/17 130/85  09/05/16 (!) 135/94   Borderline DM2-  Not really watching her sugar but reports that her last weight was 190. Had some stress last year and thinks that stress contributed to her weight loss.  Wt Readings from Last 3 Encounters:  09/26/17 195 lb 4 oz (88.6 kg)  02/26/17 210 lb (95.3 kg)  09/05/16 205 lb (93 kg)    Lab Results  Component Value Date   HGBA1C 6.1 09/14/2015   HGBA1C 6.3 10/05/2014   HGBA1C 6.0 06/11/2014   Lab Results  Component Value Date   MICROALBUR 1.19 10/19/2013   [place] [address] (H) 08/31/2016   CREATININE 1.07 09/26/2017      Observations/Objective:   Gen: Awake, alert, no acute distress Resp: Breathing is even and non-labored Psych: calm/pleasant demeanor Neuro: Alert and Oriented x 3, + facial symmetry, speech is clear.  Assessment and Plan:  HTN- uncontrolled. Advised pt as follows:  Add toprol xl 50mg  once daily.  Check blood pressure and heart  rate once daily for 1 week and send me you readings via mychart.  Hyperglycemia- she has lost some weight which is good.  Plan A1C when she comes in for her follow up visit face to face in  3 months.    Follow Up Instructions:    I discussed the assessment and treatment plan with the patient. The patient was provided an opportunity to ask questions and all were answered. The patient agreed with the plan and demonstrated an understanding of the instructions.   The patient was advised to call back or seek an in-person evaluation if the symptoms worsen or if the condition fails to improve as anticipated.    Nance Pear, NP

## 2018-12-12 NOTE — Patient Instructions (Signed)
Add toprol xl 50mg  once daily.  Check blood pressure and heart rate once daily for 1 week and send me you readings via mychart.

## 2019-01-23 ENCOUNTER — Other Ambulatory Visit: Payer: Self-pay

## 2019-01-23 ENCOUNTER — Telehealth (INDEPENDENT_AMBULATORY_CARE_PROVIDER_SITE_OTHER): Payer: PRIVATE HEALTH INSURANCE | Admitting: Family

## 2019-01-23 ENCOUNTER — Encounter: Payer: Self-pay | Admitting: Family

## 2019-01-23 VITALS — Ht 62.0 in

## 2019-01-23 DIAGNOSIS — N3000 Acute cystitis without hematuria: Secondary | ICD-10-CM

## 2019-01-23 MED ORDER — CEPHALEXIN 500 MG PO CAPS: 500.0000 mg | ORAL_CAPSULE | Freq: Three times a day (TID) | ORAL | 0 refills | Status: DC

## 2019-01-23 NOTE — Progress Notes (Signed)
Virtual Visit via Video Note  I connected with@ on 01/23/19 at  8:40 AM EDT by a video enabled telemedicine application and verified that I am speaking with the correct person using two identifiers. This visit type was conducted due to national recommendations for restrictions regarding the COVID-19 Pandemic (e.g. social distancing).  This format is felt to be most appropriate for this patient at this time.   I discussed the limitations of evaluation and management by telemedicine and the availability of in person appointments. The patient expressed understanding and agreed to proceed.  Only the patient and myself were on today's video visit. The patient was at home and I was in my office at the time of today's visit.   History of Present Illness:  Patient is a 48 yr old female who presents today to discuss possible UTI. She reports dysuria/frequency which began 5/28 in the early AM.  Reports that she went to [employer] and took AZO tabs which gave her some brief relief.  Reports that she took another one overnight and had an episode of vomiting. Denies associated fever, hematuria or low back pain.    Observations/Objective:   Gen: Awake, alert, no acute distress Resp: Breathing is even and non-labored Psych: calm/pleasant demeanor Neuro: Alert and Oriented x 3, + facial symmetry, speech is clear.  Assessment and Plan:  UTI- symptoms are most consistent with UTI. Will initiate keflex. She is advised to call if new/worsening symptoms, fever, or development of low back pain. Pt verbalizes understanding.   Follow Up Instructions:    I discussed the assessment and treatment plan with the patient. The patient was provided an opportunity to ask questions and all were answered. The patient agreed with the plan and demonstrated an understanding of the instructions.   The patient was advised to call back or seek an in-person evaluation if the symptoms worsen or if the condition fails to improve as  anticipated.    Nance Pear, NP

## 2019-02-13 ENCOUNTER — Telehealth: Payer: Self-pay | Admitting: Family

## 2019-02-13 ENCOUNTER — Encounter (HOSPITAL_BASED_OUTPATIENT_CLINIC_OR_DEPARTMENT_OTHER): Payer: Self-pay | Admitting: Emergency Medicine

## 2019-02-13 ENCOUNTER — Emergency Department (HOSPITAL_BASED_OUTPATIENT_CLINIC_OR_DEPARTMENT_OTHER)
Admission: EM | Admit: 2019-02-13 | Discharge: 2019-02-13 | Disposition: A | Discharge status: Home / Self Care | Payer: PRIVATE HEALTH INSURANCE | Attending: Emergency Medicine | Admitting: Emergency Medicine

## 2019-02-13 ENCOUNTER — Other Ambulatory Visit: Payer: Self-pay

## 2019-02-13 DIAGNOSIS — N12 Tubulo-interstitial nephritis, not specified as acute or chronic: Secondary | ICD-10-CM

## 2019-02-13 DIAGNOSIS — R1031 Right lower quadrant pain: Secondary | ICD-10-CM | POA: Diagnosis present

## 2019-02-13 LAB — URINALYSIS, ROUTINE W REFLEX MICROSCOPIC
Bilirubin Urine: NEGATIVE
Glucose, UA: NEGATIVE mg/dL
Ketones, ur: NEGATIVE mg/dL
Nitrite: POSITIVE — AB
Protein, ur: 30 mg/dL — AB
Specific Gravity, Urine: 1.025 (ref 1.005–1.030)
pH: 6 (ref 5.0–8.0)

## 2019-02-13 LAB — URINALYSIS, MICROSCOPIC (REFLEX): WBC, UA: >50 WBC/hpf (ref 0–5)

## 2019-02-13 MED ORDER — HYDROCODONE-ACETAMINOPHEN 5-325 MG PO TABS: 1.0000 | ORAL_TABLET | ORAL | 0 refills | Status: DC | PRN

## 2019-02-13 MED ORDER — PHENAZOPYRIDINE HCL 200 MG PO TABS: 200.0000 mg | ORAL_TABLET | Freq: Three times a day (TID) | ORAL | 0 refills | Status: DC

## 2019-02-13 MED ORDER — CIPROFLOXACIN HCL 500 MG PO TABS
500.0000 mg | ORAL_TABLET | Freq: Once | ORAL | Status: AC
  Administered 2019-02-13: 04:00:00 500 mg via ORAL
  Filled 2019-02-13: qty 1

## 2019-02-13 MED ORDER — PHENAZOPYRIDINE HCL 100 MG PO TABS
200.0000 mg | ORAL_TABLET | Freq: Once | ORAL | Status: AC
  Administered 2019-02-13: 200 mg via ORAL
  Filled 2019-02-13: qty 2

## 2019-02-13 MED ORDER — CIPROFLOXACIN HCL 500 MG PO TABS: 500.0000 mg | ORAL_TABLET | Freq: Two times a day (BID) | ORAL | 0 refills | Status: DC

## 2019-02-13 NOTE — ED Provider Notes (Signed)
[facility] Provider Note: Kristin Spurling, MD, FACEP  CSN: [id number] MRN: [id number] ARRIVAL: 02/13/19 at [place]: [place]  Flank Pain   HISTORY OF PRESENT ILLNESS  02/13/19 4:05 AM Kristin Herrera is a 48 y.o. female with a 4-day history of right flank pain radiating to her right lower quadrant.  The pain was well controlled with acetaminophen and ibuprofen until yesterday when the pain worsened.  She rates it as a 10 out of 10 presently.  It is worse with movement or palpation of the right flank.  She has had urinary frequency but thinks this may be due to increased fluid intake.  She has not had dysuria or suprapubic discomfort as she did last month with a UTI.  She denies nausea, vomiting, fever or chills.   Past Medical History:  Diagnosis Date  . Diverticulitis   . Frequent episodic tension-type headache   . Gestational diabetes   . Hypertension   . Leukocytosis 09/05/2016  . Routine screening for STI (sexually transmitted infection) 09/05/2016    Past Surgical History:  Procedure Laterality Date  . ABDOMINAL HYSTERECTOMY  09/2015  . [place]  . ROBOTIC ASSISTED TOTAL HYSTERECTOMY WITH SALPINGECTOMY Bilateral 09/29/2015   Procedure: ROBOTIC ASSISTED TOTAL HYSTERECTOMY WITH SALPINGECTOMY;  Surgeon: Princess Bruins, MD;  Location: [facility];  Service: Gynecology;  Laterality: Bilateral;  . TUBAL LIGATION  1999    Family History  Problem Relation Age of Onset  . Hypertension Mother   . Heart disease Father        CAD, died from MI at age 56  . Heart disease Paternal Uncle   . Heart disease Paternal Grandfather   . Hypertension Maternal Grandmother   . Diabetes Maternal Grandmother   . Stroke Paternal Grandmother   . Hypertension Paternal Grandmother   . Diabetes Paternal Grandmother   . Diabetes Daughter     Social History   Tobacco Use  . Smoking status: Former Smoker    Years: 10.00    Types: Cigarettes   Quit date: 03/10/2010    Years since quitting: 8.9  . Smokeless tobacco: Never Used  Substance Use Topics  . Alcohol use: No    Alcohol/week: 0.0 standard drinks  . Drug use: No    Prior to Admission medications   Medication Sig Start Date End Date Taking? Authorizing Provider  cephALEXin (KEFLEX) 500 MG capsule Take 1 capsule (500 mg total) by mouth 3 (three) times daily. 01/23/19   Debbrah Alar, NP  metoprolol succinate (TOPROL-XL) 50 MG 24 hr tablet Take 1 tablet (50 mg total) by mouth daily. Take with or immediately following a meal. 12/12/18   Debbrah Alar, NP    Allergies Simvastatin   REVIEW OF SYSTEMS  Negative except as noted here or in the History of Present Illness.   PHYSICAL EXAMINATION  Initial Vital Signs Blood pressure (!) 205/120, pulse 80, temperature 98.7 F (37.1 C), temperature source Oral, resp. rate 18, height 5\' 2"  (1.575 m), weight 88 kg, last menstrual period 09/08/2015, SpO2 100 %.  Examination General: Well-developed, well-nourished female in no acute distress; appearance consistent with age of record HENT: normocephalic; atraumatic Eyes: pupils equal, round and reactive to light; extraocular muscles intact Neck: supple Heart: regular rate and rhythm Lungs: clear to auscultation bilaterally Abdomen: soft; nondistended; nontender; bowel sounds present GU: Right CVA tenderness Extremities: No deformity; full range of motion; pulses normal Neurologic: Awake, alert and oriented; motor function intact  in all extremities and symmetric; no facial droop Skin: Warm and dry Psychiatric: Normal mood and affect   RESULTS  Summary of this visit's results, reviewed by myself:   EKG Interpretation  Date/Time:    Ventricular Rate:    PR Interval:    QRS Duration:   QT Interval:    QTC Calculation:   R Axis:     Text Interpretation:        Laboratory Studies: Results for orders placed or performed during the hospital encounter of  02/13/19 (from the past 24 hour(s))  Urinalysis, Routine w reflex microscopic     Status: Abnormal   Collection Time: 02/13/19  3:55 AM  Result Value Ref Range   Color, Urine YELLOW YELLOW   APPearance CLOUDY (A) CLEAR   Specific Gravity, Urine 1.025 1.005 - 1.030   pH 6.0 5.0 - 8.0   Glucose, UA NEGATIVE NEGATIVE mg/dL   Hgb urine dipstick LARGE (A) NEGATIVE   Bilirubin Urine NEGATIVE NEGATIVE   Ketones, ur NEGATIVE NEGATIVE mg/dL   Protein, ur 30 (A) NEGATIVE mg/dL   Nitrite POSITIVE (A) NEGATIVE   Leukocytes,Ua MODERATE (A) NEGATIVE  Urinalysis, Microscopic (reflex)     Status: Abnormal   Collection Time: 02/13/19  3:55 AM  Result Value Ref Range   RBC / HPF 6-10 0 - 5 RBC/hpf   WBC, UA >50 0 - 5 WBC/hpf   Bacteria, UA MANY (A) NONE SEEN   Squamous Epithelial / LPF 0-5 0 - 5   WBC Clumps PRESENT    Imaging Studies: No results found.  ED COURSE and MDM  Nursing notes and initial vitals signs, including pulse oximetry, reviewed.  Vitals:   02/13/19 0337 02/13/19 0340  BP:  (!) 205/120  Pulse:  80  Resp:  18  Temp:  98.7 F (37.1 C)  TempSrc:  Oral  SpO2:  100%  Weight: 88 kg   Height: 5\' 2"  (1.575 m)    4:17 AM Examination and urinalysis are consistent with acute pyelonephritis.  Ureterolithiasis cannot be excluded but I feel this is less likely.  She was advised if pain persists despite treatment with antibiotics then we would consider a CT scan to evaluate for stones.   PROCEDURES    ED DIAGNOSES     ICD-10-CM   1. Pyelonephritis  N12        Joretta Eads, MD 02/13/19 [phone]

## 2019-02-13 NOTE — ED Notes (Signed)
Pt understood dc material. NAD noted. Script sent electronically. All questions answered to satisfaction. Pt escorted to checkout counter.

## 2019-02-13 NOTE — Telephone Encounter (Signed)
Please contact pt to schedule a face to face office visit on Tuesday next week.

## 2019-02-13 NOTE — ED Triage Notes (Signed)
Patient complains of right lower back pain radiating around right flank; states onset 4 days ago; worse tonight; states treated for UTI 1 month ago. Denies any urinary urgency or pain; co urinary frequency.

## 2019-02-15 LAB — URINE CULTURE: Culture: >=100000 — AB

## 2019-02-16 ENCOUNTER — Encounter: Payer: Self-pay | Admitting: Family

## 2019-02-16 ENCOUNTER — Ambulatory Visit (INDEPENDENT_AMBULATORY_CARE_PROVIDER_SITE_OTHER): Payer: PRIVATE HEALTH INSURANCE | Admitting: Family

## 2019-02-16 ENCOUNTER — Ambulatory Visit (HOSPITAL_BASED_OUTPATIENT_CLINIC_OR_DEPARTMENT_OTHER): Payer: PRIVATE HEALTH INSURANCE

## 2019-02-16 ENCOUNTER — Other Ambulatory Visit: Payer: Self-pay

## 2019-02-16 ENCOUNTER — Telehealth: Payer: Self-pay | Admitting: Emergency Medicine

## 2019-02-16 DIAGNOSIS — R109 Unspecified abdominal pain: Secondary | ICD-10-CM

## 2019-02-16 DIAGNOSIS — I10 Essential (primary) hypertension: Secondary | ICD-10-CM

## 2019-02-16 DIAGNOSIS — R1031 Right lower quadrant pain: Secondary | ICD-10-CM

## 2019-02-16 NOTE — Telephone Encounter (Signed)
Are there no openings next Tuesday?

## 2019-02-16 NOTE — Telephone Encounter (Signed)
Yes. Pt will be in 02/24/19

## 2019-02-16 NOTE — Telephone Encounter (Signed)
Post ED Visit - Positive Culture Follow-up  Culture report reviewed by antimicrobial stewardship pharmacist: [facility] Team []  Elenor Quinones, Pharm.D. []  Heide Guile, Pharm.D., BCPS AQ-ID []  Parks Neptune, Pharm.D., BCPS []  Alycia Rossetti, Pharm.D., BCPS []  Luther, Pharm.D., BCPS, AAHIVP []  Legrand Como, Pharm.D., BCPS, AAHIVP [x]  Salome Arnt, PharmD, BCPS []  Johnnette Gourd, PharmD, BCPS []  Hughes Better, PharmD, BCPS []  Leeroy Cha, PharmD []  Laqueta Linden, PharmD, BCPS []  Albertina Parr, PharmD  [place] Team []  Leodis Sias, PharmD []  Lindell Spar, PharmD []  Royetta Asal, PharmD []  Graylin Shiver, Rph []  Rema Fendt) Glennon Mac, PharmD []  Arlyn Dunning, PharmD []  Netta Cedars, PharmD []  Dia Sitter, PharmD []  Leone Haven, PharmD []  Gretta Arab, PharmD []  Theodis Shove, PharmD []  Peggyann Juba, PharmD []  Reuel Boom, PharmD   Positive urine culture Treated with ciprofloxacin, organism sensitive to the same and no further patient follow-up is required at this time.  Hazle Nordmann 02/16/2019, 8:37 AM

## 2019-02-16 NOTE — Progress Notes (Signed)
Virtual Visit via Video Note  I connected with Collier Bullock on 02/16/19 at 12:00 PM EDT by a video enabled telemedicine application and verified that I am speaking with the correct person using two identifiers.  Location: Patient: home Provider: home   I discussed the limitations of evaluation and management by telemedicine and the availability of in person appointments. The patient expressed understanding and agreed to proceed.  History of Present Illness:  Patient is a 48 yr old female who presents today for ED follow up. ED records are reviewed.  She presented with right sided flank pain which radiated down to the right lower quadrant.  She also had some urinary frequency. She was treated with   She reports that pain is still there but no as bad as it was. Reports improvement in her urinary frequency. Denies fever or blood in the urine.  She denies hx of kidney stone. Has been needing to take pain medication which is helpful but makes her sleep so she does not feel she can return to work while she is requiring pain medication.  HTN-reports that she had run out of her medication prior to her ED visit. Now back on medication for 3 days.   BP Readings from Last 3 Encounters:  02/13/19 (!) 205/120  09/26/17 (!) 140/100  02/26/17 130/85      Observations/Objective:   Gen: Awake, alert, no acute distress Resp: Breathing is even and non-labored Psych: calm/pleasant demeanor Neuro: Alert and Oriented x 3, + facial symmetry, speech is clear.   Assessment and Plan: Right sided flank pain- concern for kidney stone. Will have pt complete a CT today to rule out stone.  Pt verabalizes understanding.   HTN- she will check her blood pressure with her family member's cuff and will then send me her reading via mychart. Plan to adjust medication if needed.  Follow Up Instructions:    I discussed the assessment and treatment plan with the patient. The patient was provided an opportunity to  ask questions and all were answered. The patient agreed with the plan and demonstrated an understanding of the instructions.   The patient was advised to call back or seek an in-person evaluation if the symptoms worsen or if the condition fails to improve as anticipated.  Nance Pear, NP

## 2019-02-17 ENCOUNTER — Ambulatory Visit (HOSPITAL_BASED_OUTPATIENT_CLINIC_OR_DEPARTMENT_OTHER)
Admission: RE | Admit: 2019-02-17 | Discharge: 2019-02-17 | Disposition: A | Discharge status: Home / Self Care | Payer: PRIVATE HEALTH INSURANCE | Source: Ambulatory Visit | Attending: Family | Admitting: Family

## 2019-02-17 ENCOUNTER — Other Ambulatory Visit: Payer: Self-pay

## 2019-02-17 ENCOUNTER — Telehealth: Payer: Self-pay | Admitting: Family

## 2019-02-17 DIAGNOSIS — R1031 Right lower quadrant pain: Secondary | ICD-10-CM

## 2019-02-17 DIAGNOSIS — K769 Liver disease, unspecified: Secondary | ICD-10-CM

## 2019-02-18 NOTE — Telephone Encounter (Signed)
Pt called regarding her CT scan yesterday. Please advise.

## 2019-02-18 NOTE — Telephone Encounter (Signed)
Spoke to patient. Reviewed findings on CT. She states that she still is having a "nagging" pain in the right side into her right flank. Better than it was on Monday but "still there." She continues cipro.  I have asked her to send me a my chart message on Friday updating me with how she is feeling and reach out sooner if symptoms worsen.  She verbalizes understanding. Also discussed need to proceed with MRI to further evaluate liver lesion. Pt is agreeable to proceed.

## 2019-02-24 ENCOUNTER — Ambulatory Visit: Payer: PRIVATE HEALTH INSURANCE | Admitting: Family

## 2019-02-25 ENCOUNTER — Ambulatory Visit (INDEPENDENT_AMBULATORY_CARE_PROVIDER_SITE_OTHER): Payer: PRIVATE HEALTH INSURANCE | Admitting: Family

## 2019-02-25 ENCOUNTER — Encounter: Payer: Self-pay | Admitting: Family

## 2019-02-25 ENCOUNTER — Other Ambulatory Visit: Payer: Self-pay

## 2019-02-25 VITALS — BP 188/106 | HR 62 | Temp 98.9°F | Resp 16 | Ht 62.0 in | Wt 192.0 lb

## 2019-02-25 DIAGNOSIS — Z87898 Personal history of other specified conditions: Secondary | ICD-10-CM | POA: Diagnosis not present

## 2019-02-25 DIAGNOSIS — I10 Essential (primary) hypertension: Secondary | ICD-10-CM | POA: Diagnosis not present

## 2019-02-25 MED ORDER — AMLODIPINE BESYLATE 5 MG PO TABS: 5.0000 mg | ORAL_TABLET | Freq: Every day | ORAL | 3 refills | Status: DC

## 2019-02-25 NOTE — Progress Notes (Signed)
Subjective:    Patient ID: Kristin Herrera, female    DOB: 06/10/1971, 48 y.o.   MRN: [id number]  HPI   Patient is a 48 yr old female who presents today for follow up of her right sided flank pain. Last visit we completed a CT scan to evaluate for kidney stone. The right ureter was noted to be mildly edematous.  This finding was felt to potentially indicate a recent calculus passage.  She reports resolution of her pain.  HTN-the patient reports good compliance recently with her metoprolol.  Denies CP/SOB or LE edema. BP Readings from Last 3 Encounters:  02/25/19 (!) 188/106  02/13/19 (!) 205/120  09/26/17 (!) 140/100      Review of Systems     Past Medical History:  Diagnosis Date  . Diverticulitis   . Frequent episodic tension-type headache   . Gestational diabetes   . Hypertension   . Leukocytosis 09/05/2016  . Routine screening for STI (sexually transmitted infection) 09/05/2016     Social History   Socioeconomic History  . Marital status: Single    Spouse name: Not on file  . Number of children: 2  . Years of education: Not on file  . Highest education level: Not on file  Occupational History  . Occupation: Clinical research associate: [employer]  . Financial resource strain: Not on file  . Food insecurity    Worry: Not on file    Inability: Not on file  . Transportation needs    Medical: Not on file    Non-medical: Not on file  Tobacco Use  . Smoking status: Former Smoker    Years: 10.00    Types: Cigarettes    Quit date: 03/10/2010    Years since quitting: 8.9  . Smokeless tobacco: Never Used  Substance and Sexual Activity  . Alcohol use: No    Alcohol/week: 0.0 standard drinks  . Drug use: No  . Sexual activity: Yes    Partners: Male    Birth control/protection: Surgical  Lifestyle  . Physical activity    Days per week: Not on file    Minutes per session: Not on file  . Stress: Not on file  Relationships  .  Social Herbalist on phone: Not on file    Gets together: Not on file    Attends religious service: Not on file    Active member of club or organization: Not on file    Attends meetings of clubs or organizations: Not on file    Relationship status: Not on file  . Intimate partner violence    Fear of current or ex partner: Not on file    Emotionally abused: Not on file    Physically abused: Not on file    Forced sexual activity: Not on file  Other Topics Concern  . 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 ABDOMINAL HYSTERECTOMY  09/2015  . [place]  . ROBOTIC ASSISTED TOTAL HYSTERECTOMY WITH SALPINGECTOMY Bilateral 09/29/2015   Procedure: ROBOTIC ASSISTED TOTAL HYSTERECTOMY WITH SALPINGECTOMY;  Surgeon: Princess Bruins, MD;  Location: [facility];  Service: Gynecology;  Laterality: Bilateral;  . TUBAL LIGATION  1999    Family History  Problem Relation Age of Onset  . Hypertension Mother   . Heart disease Father        CAD, died from MI at age 87  . Heart disease Paternal Uncle   . Heart disease Paternal Grandfather   . Hypertension Maternal Grandmother   . Diabetes Maternal Grandmother   . Stroke Paternal Grandmother   . Hypertension Paternal Grandmother   . Diabetes Paternal Grandmother   . Diabetes Daughter     Allergies  Allergen Reactions  . Simvastatin Other (See Comments)    cramping    Current Outpatient Medications on File Prior to Visit  Medication Sig Dispense Refill  . metoprolol succinate (TOPROL-XL) 50 MG 24 hr tablet Take 1 tablet (50 mg total) by mouth daily. Take with or immediately following a meal. 30 tablet 3   No current facility-administered medications on file prior to visit.     BP (!) 188/106  (BP Location: Right Arm, Patient Position: Sitting, Cuff Size: Small)   Pulse 62   Temp 98.9 F (37.2 C) (Oral)   Resp 16   Ht 5\' 2"  (1.575 m)   Wt 192 lb (87.1 kg)   LMP 09/08/2015   SpO2 99%   BMI 35.12 kg/m    Objective:   Physical Exam Constitutional:      Appearance: She is well-developed.  Neck:     Musculoskeletal: Neck supple.     Thyroid: No thyromegaly.  Cardiovascular:     Rate and Rhythm: Normal rate and regular rhythm.     Heart sounds: Normal heart sounds. No murmur.  Pulmonary:     Effort: Pulmonary effort is normal. No respiratory distress.     Breath sounds: Normal breath sounds. No wheezing.  Skin:    General: Skin is warm and dry.  Neurological:     Mental Status: She is alert and oriented to person, place, and time.  Psychiatric:        Behavior: Behavior normal.        Thought Content: Thought content normal.        Judgment: Judgment normal.           Assessment & Plan:  Right-sided flank pain-this is resolved  Hypertension-uncontrolled.  Advised patient to continue metoprolol and add amlodipine 5 mg once daily.  Plan follow-up in 1 month for blood pressure recheck.

## 2019-02-25 NOTE — Patient Instructions (Signed)
Please add amlodipine 5mg once daily.  

## 2019-03-06 ENCOUNTER — Ambulatory Visit: Payer: PRIVATE HEALTH INSURANCE | Admitting: Family

## 2019-03-24 ENCOUNTER — Ambulatory Visit: Payer: PRIVATE HEALTH INSURANCE | Admitting: Family

## 2019-03-24 DIAGNOSIS — Z0289 Encounter for other administrative examinations: Secondary | ICD-10-CM

## 2019-07-22 ENCOUNTER — Other Ambulatory Visit: Payer: Self-pay | Admitting: Family

## 2019-09-22 ENCOUNTER — Other Ambulatory Visit: Payer: Self-pay | Admitting: Family

## 2019-09-22 NOTE — Telephone Encounter (Signed)
Pt overdue for follow up. Please contact pt to schedule OV.

## 2019-09-28 ENCOUNTER — Ambulatory Visit (INDEPENDENT_AMBULATORY_CARE_PROVIDER_SITE_OTHER): Payer: Self-pay | Admitting: Family

## 2019-09-28 ENCOUNTER — Other Ambulatory Visit: Payer: Self-pay

## 2019-09-28 ENCOUNTER — Encounter: Payer: Self-pay | Admitting: Family

## 2019-09-28 ENCOUNTER — Telehealth: Payer: Self-pay | Admitting: Family

## 2019-09-28 DIAGNOSIS — Z0000 Encounter for general adult medical examination without abnormal findings: Secondary | ICD-10-CM

## 2019-09-28 DIAGNOSIS — K769 Liver disease, unspecified: Secondary | ICD-10-CM

## 2019-09-28 DIAGNOSIS — I10 Essential (primary) hypertension: Secondary | ICD-10-CM

## 2019-09-28 NOTE — Telephone Encounter (Signed)
-----   Message from Lincoln sent at 09/28/2019  3:59 PM EST ----- Regarding: Lab work Kristin Herrera,   The current patient will need to have lab work due to having hypertension. If you could please order and the patient come get those in your office, we will be happy to schedule the MRI for this Saturday.

## 2019-09-28 NOTE — Telephone Encounter (Signed)
Please call pt to schedule a BMET this week for her MRI.

## 2019-09-28 NOTE — Progress Notes (Signed)
Virtual Visit via Video Note  I connected with Kristin Herrera on 09/28/19 at 11:40 AM EST by a video enabled telemedicine application and verified that I am speaking with the correct person using two identifiers.  Location: Patient: home Provider: home   I discussed the limitations of evaluation and management by telemedicine and the availability of in person appointments. The patient expressed understanding and agreed to proceed.  History of Present Illness:  HTN- reports that last week her bp was 153/93- she was out of medication at that time.  Maintained on amlodipine and metoprolol. Does not have a bp cuff at home.  She reports bp 130's/80's when she is compliant with her medication.  BP Readings from Last 3 Encounters:  02/25/19 (!) 188/106  02/13/19 (!) 205/120  09/26/17 (!) 140/100   Past Medical History:  Diagnosis Date  . Diverticulitis   . Frequent episodic tension-type headache   . Gestational diabetes   . Hypertension   . Leukocytosis 09/05/2016  . Routine screening for STI (sexually transmitted infection) 09/05/2016     Social History   Socioeconomic History  . Marital status: Single    Spouse name: Not on file  . Number of children: 2  . Years of education: Not on file  . Highest education level: Not on file  Occupational History  . Occupation: HOUSEHOLD Programmer, applications: [employer]  Tobacco Use  . Smoking status: Former Smoker    Years: 10.00    Types: Cigarettes    Quit date: 03/10/2010    Years since quitting: 9.5  . Smokeless tobacco: Never Used  Substance and Sexual Activity  . Alcohol use: No    Alcohol/week: 0.0 standard drinks  . Drug use: No  . Sexual activity: Yes    Partners: Male    Birth control/protection: Surgical  Other Topics Concern  . 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 Difficulty of Paying Living Expenses: Not on file  Food Insecurity:   . Worried About Charity fundraiser in the Last Year: Not on file  . Ran Out of Food in the Last Year: Not on file  Transportation Needs:   . Lack of Transportation (Medical): Not on file  . Lack of Transportation (Non-Medical): Not on file  Physical Activity:   . Days of Exercise per Week: Not on file  . Minutes of Exercise per Session: Not on file  Stress:   . Feeling of Stress : Not on file  Social Connections:   . Frequency of Communication with Friends and Family: Not on file  . Frequency of Social Gatherings with Friends and Family: Not on file  . Attends Religious Services: Not on file  . Active Member of Clubs or Organizations: Not on file  . Attends Archivist Meetings: Not on file  . Marital Status: Not on file  Intimate Partner Violence:   . Fear of Current or Ex-Partner: Not on file  . Emotionally Abused: Not on file  . Physically Abused: Not on file  . Sexually Abused: Not on file    Past Surgical History:  Procedure Laterality Date  . ABDOMINAL HYSTERECTOMY  09/2015  .  [place]  . ROBOTIC ASSISTED TOTAL HYSTERECTOMY WITH SALPINGECTOMY Bilateral 09/29/2015   Procedure: ROBOTIC ASSISTED TOTAL HYSTERECTOMY WITH SALPINGECTOMY;  Surgeon: Princess Bruins, MD;  Location: [facility];  Service: Gynecology;  Laterality: Bilateral;  . TUBAL LIGATION  1999    Family History  Problem Relation Age of Onset  . Hypertension Mother   . Heart disease Father        CAD, died from MI at age 43  . Heart disease Paternal Uncle   . Heart disease Paternal Grandfather   . Hypertension Maternal Grandmother   . Diabetes Maternal Grandmother   . Stroke Paternal Grandmother   . Hypertension Paternal Grandmother   . Diabetes Paternal Grandmother   . Diabetes Daughter     Allergies   Allergen Reactions  . Simvastatin Other (See Comments)    cramping    Current Outpatient Medications on File Prior to Visit  Medication Sig Dispense Refill  . amLODipine (NORVASC) 5 MG tablet TAKE 1 TABLET (5 MG TOTAL) BY MOUTH DAILY. 30 tablet 0  . metoprolol succinate (TOPROL-XL) 50 MG 24 hr tablet TAKE 1 TABLET BY MOUTH ONCE DAILY WITH OR IMMEDIATELY FOLLOWING A MEAL 30 tablet 3   No current facility-administered medications on file prior to visit.    LMP 09/08/2015     Observations/Objective:   Gen: Awake, alert, no acute distress Resp: Breathing is even and non-labored Psych: calm/pleasant demeanor Neuro: Alert and Oriented x 3, + facial symmetry, speech is clear.   Assessment and Plan:  HTN- reportedly stable. Pt counseled on the long term risks of untreated HTN.  Continue current meds. Plan to bring her in for a complete physical and will check her blood pressure at that time.   Liver lesion- noted on CT. She did not have insurance until recently and could not afford recommended MRI. Recently obtained insurance and would like to complete.  Will re-order.    Follow Up Instructions:    I discussed the assessment and treatment plan with the patient. The patient was provided an opportunity to ask questions and all were answered. The patient agreed with the plan and demonstrated an understanding of the instructions.   The patient was advised to call back or seek an in-person evaluation if the symptoms worsen or if the condition fails to improve as anticipated.  Nance Pear, NP

## 2019-09-29 ENCOUNTER — Encounter: Payer: Self-pay | Admitting: Family

## 2019-09-30 NOTE — Telephone Encounter (Signed)
Called and scheduled patient for Bmet tomorrow

## 2019-10-01 ENCOUNTER — Other Ambulatory Visit (INDEPENDENT_AMBULATORY_CARE_PROVIDER_SITE_OTHER): Payer: PRIVATE HEALTH INSURANCE

## 2019-10-01 ENCOUNTER — Other Ambulatory Visit: Payer: Self-pay

## 2019-10-01 DIAGNOSIS — I10 Essential (primary) hypertension: Secondary | ICD-10-CM

## 2019-10-01 LAB — BASIC METABOLIC PANEL
BUN: 17 mg/dL (ref 6–23)
CO2: 28 mEq/L (ref 19–32)
Calcium: 9.2 mg/dL (ref 8.4–10.5)
Chloride: 103 mEq/L (ref 96–112)
Creatinine, Ser: 1.11 mg/dL (ref 0.40–1.20)
GFR: 63.71 mL/min (ref >60.00)
Glucose, Bld: 118 mg/dL — ABNORMAL HIGH (ref 70–99)
Potassium: 4.1 mEq/L (ref 3.5–5.1)
Sodium: 138 mEq/L (ref 135–145)

## 2019-10-05 ENCOUNTER — Other Ambulatory Visit: Payer: PRIVATE HEALTH INSURANCE

## 2019-10-12 ENCOUNTER — Other Ambulatory Visit: Payer: Self-pay

## 2019-10-12 ENCOUNTER — Ambulatory Visit (INDEPENDENT_AMBULATORY_CARE_PROVIDER_SITE_OTHER): Payer: PRIVATE HEALTH INSURANCE

## 2019-10-12 ENCOUNTER — Telehealth: Payer: Self-pay

## 2019-10-12 DIAGNOSIS — K769 Liver disease, unspecified: Secondary | ICD-10-CM | POA: Diagnosis not present

## 2019-10-12 MED ORDER — GADOXETATE DISODIUM 0.25 MMOL/ML IV SOLN
8.5000 mL | Freq: Once | INTRAVENOUS | Status: AC | PRN
  Administered 2019-10-12: 8.5 mL via INTRAVENOUS

## 2019-10-12 NOTE — Telephone Encounter (Signed)
Carloyn Manner from [facility] called in to give a report on the patients results from her MRI  Please give Carloyn Manner a call at [phone]  Thanks,

## 2019-10-12 NOTE — Telephone Encounter (Signed)
Called roy back, wanted to informed of impressions. Report on Epic.

## 2019-10-13 ENCOUNTER — Telehealth: Payer: Self-pay | Admitting: Family

## 2019-10-13 NOTE — Telephone Encounter (Signed)
Spoke to pt. Reviewed MRI results. She is not on OCP and understands to avoid OCP's. Plan to repeat MRI in 6 months and pt is agreeable.

## 2019-10-16 ENCOUNTER — Other Ambulatory Visit: Payer: Self-pay | Admitting: Family

## 2019-10-16 DIAGNOSIS — R928 Other abnormal and inconclusive findings on diagnostic imaging of breast: Secondary | ICD-10-CM

## 2019-10-16 NOTE — Addendum Note (Signed)
Addended by: Debbrah Alar on: 10/16/2019 08:20 AM   Modules accepted: Orders

## 2019-10-27 ENCOUNTER — Other Ambulatory Visit: Payer: Self-pay | Admitting: Family

## 2019-10-30 ENCOUNTER — Ambulatory Visit: Payer: PRIVATE HEALTH INSURANCE | Admitting: Family

## 2019-10-30 ENCOUNTER — Other Ambulatory Visit: Payer: Self-pay

## 2019-10-30 ENCOUNTER — Encounter: Payer: Self-pay | Admitting: Family

## 2019-10-30 VITALS — BP 159/105 | HR 60 | Temp 97.4°F | Resp 16 | Ht 62.0 in | Wt 199.0 lb

## 2019-10-30 DIAGNOSIS — I10 Essential (primary) hypertension: Secondary | ICD-10-CM

## 2019-10-30 DIAGNOSIS — M722 Plantar fascial fibromatosis: Secondary | ICD-10-CM | POA: Diagnosis not present

## 2019-10-30 MED ORDER — MELOXICAM 7.5 MG PO TABS: 7.5000 mg | ORAL_TABLET | Freq: Every day | ORAL | 0 refills | Status: DC

## 2019-10-30 NOTE — Progress Notes (Signed)
Subjective:    Patient ID: Kristin Herrera, female    DOB: 1970/12/27, 49 y.o.   MRN: [id number]  HPI  Patient is a 49 yr old female who presents today for follow up.  HTN- current medications include amlodipine 5mg  and toprol xl 50mg . Reports that she her numbers are typically 130/80's.  Denies LE edema.    BP Readings from Last 3 Encounters:  10/30/19 (!) 159/105  02/25/19 (!) 188/106  02/13/19 (!) 205/120   Foot pain- c/o pain in both heels.  Reports that she stands a lot a work.     Review of Systems    see HPI  Past Medical History:  Diagnosis Date  . Diverticulitis   . Frequent episodic tension-type headache   . Gestational diabetes   . Hypertension   . Leukocytosis 09/05/2016  . Routine screening for STI (sexually transmitted infection) 09/05/2016     Social History   Socioeconomic History  . Marital status: Single    Spouse name: Not on file  . Number of children: 2  . Years of education: Not on file  . Highest education level: Not on file  Occupational History  . Occupation: HOUSEHOLD Programmer, applications: [employer]  Tobacco Use  . Smoking status: Former Smoker    Years: 10.00    Types: Cigarettes    Quit date: 03/10/2010    Years since quitting: 9.6  . Smokeless tobacco: Never Used  Substance and Sexual Activity  . Alcohol use: No    Alcohol/week: 0.0 standard drinks  . Drug use: No  . Sexual activity: Yes    Partners: Male    Birth control/protection: Surgical  Other Topics Concern  . 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 Difficulty of Paying Living Expenses: Not on file  Food Insecurity:   . Worried About Charity fundraiser in the Last Year: Not on file  . Ran Out of Food in the Last Year: Not  on file  Transportation Needs:   . Lack of Transportation (Medical): Not on file  . Lack of Transportation (Non-Medical): Not on file  Physical Activity:   . Days of Exercise per Week: Not on file  . Minutes of Exercise per Session: Not on file  Stress:   . Feeling of Stress : Not on file  Social Connections:   . Frequency of Communication with Friends and Family: Not on file  . Frequency of Social Gatherings with Friends and Family: Not on file  . Attends Religious Services: Not on file  . Active Member of Clubs or Organizations: Not on file  . Attends Archivist Meetings: Not on file  . Marital Status: Not on file  Intimate Partner Violence:   . Fear of Current or Ex-Partner: Not on file  . Emotionally Abused: Not on file  . Physically Abused: Not on file  . Sexually Abused: Not on file    Past Surgical History:  Procedure Laterality Date  . ABDOMINAL HYSTERECTOMY  09/2015  . Learned  . ROBOTIC ASSISTED TOTAL HYSTERECTOMY WITH SALPINGECTOMY Bilateral 09/29/2015   Procedure: ROBOTIC ASSISTED TOTAL HYSTERECTOMY WITH SALPINGECTOMY;  Surgeon: Princess Bruins, MD;  Location: [facility];  Service: Gynecology;  Laterality: Bilateral;  . TUBAL LIGATION  1999    Family History  Problem Relation Age of Onset  . Hypertension Mother   . Heart disease Father        CAD, died from MI at age 11  . Heart disease Paternal Uncle   . Heart disease Paternal Grandfather   . Hypertension Maternal Grandmother   . Diabetes Maternal Grandmother   . Stroke Paternal Grandmother   . Hypertension Paternal Grandmother   . Diabetes Paternal Grandmother   . Diabetes Daughter     Allergies  Allergen Reactions  . Simvastatin Other (See Comments)    cramping    Current Outpatient Medications on File Prior to Visit  Medication Sig Dispense Refill  . amLODipine (NORVASC) 5 MG tablet TAKE 1 TABLET (5 MG TOTAL) BY MOUTH DAILY. 30 tablet 0  . metoprolol succinate  (TOPROL-XL) 50 MG 24 hr tablet TAKE 1 TABLET BY MOUTH ONCE DAILY WITH OR IMMEDIATELY FOLLOWING A MEAL 30 tablet 3   No current facility-administered medications on file prior to visit.    BP (!) 159/105 (BP Location: Right Arm, Patient Position: Sitting, Cuff Size: Small)   Pulse 60   Temp (!) 97.4 F (36.3 C) (Temporal)   Resp 16   Ht 5\' 2"  (1.575 m)   Wt 199 lb (90.3 kg)   LMP 09/08/2015   SpO2 100%   BMI 36.40 kg/m    Objective:   Physical Exam Constitutional:      Appearance: She is well-developed.  Neck:     Thyroid: No thyromegaly.  Cardiovascular:     Rate and Rhythm: Normal rate and regular rhythm.     Heart sounds: Normal heart sounds. No murmur.  Pulmonary:     Effort: Pulmonary effort is normal. No respiratory distress.     Breath sounds: Normal breath sounds. No wheezing.  Musculoskeletal:     Cervical back: Neck supple.     Comments: Bilateral heel tenderness to palpation  Skin:    General: Skin is warm and dry.  Neurological:     Mental Status: She is alert and oriented to person, place, and time.  Psychiatric:        Behavior: Behavior normal.        Thought Content: Thought content normal.        Judgment: Judgment normal.           Assessment & Plan:  Hypertension-blood pressure is elevated today.  She notes much better pressures when she checks it at work.  We will continue current meds for now and I have asked her to check her blood pressure on Tuesday when she returns to work.  If still elevated will need adjustment.  Plantar fasciitis-new.  Patient was advised on exercises to perform for plantar fasciitis, icing, and use of anti-inflammatory.  We also discussed importance of wearing good supportive shoes.  This visit occurred during the SARS-CoV-2 public health emergency.  Safety protocols were in place, including screening questions prior to the visit, additional usage of staff PPE, and extensive cleaning of exam room while observing  appropriate contact time as indicated for disinfecting solutions.

## 2019-10-30 NOTE — Patient Instructions (Addendum)
Begin meloxicam once daily as needed for heel pain. Wear supportive footwear while working. Ice feet twice daily.  Send me your repeat blood pressure on Tuesday via mychart.

## 2019-10-30 NOTE — Progress Notes (Signed)
0 0

## 2019-12-07 ENCOUNTER — Other Ambulatory Visit: Payer: Self-pay | Admitting: Family

## 2019-12-28 ENCOUNTER — Telehealth (INDEPENDENT_AMBULATORY_CARE_PROVIDER_SITE_OTHER): Payer: PRIVATE HEALTH INSURANCE | Admitting: Family

## 2019-12-28 ENCOUNTER — Other Ambulatory Visit: Payer: Self-pay

## 2019-12-28 ENCOUNTER — Telehealth: Payer: Self-pay

## 2019-12-28 DIAGNOSIS — N3000 Acute cystitis without hematuria: Secondary | ICD-10-CM

## 2019-12-28 MED ORDER — NITROFURANTOIN MONOHYD MACRO 100 MG PO CAPS: 100.0000 mg | ORAL_CAPSULE | Freq: Two times a day (BID) | ORAL | 0 refills | Status: DC

## 2019-12-28 NOTE — Progress Notes (Signed)
Virtual Visit via Video Note  I connected with Kristin Herrera on 12/28/19 at 12:00 PM EDT by a video enabled telemedicine application and verified that I am speaking with the correct person using two identifiers.  Location: Patient: home Provider: work   I discussed the limitations of evaluation and management by telemedicine and the availability of in person appointments. The patient expressed understanding and agreed to proceed.  History of Present Illness:  Reports urinary urgency, frequency, pain. Reports that symptoms started 2 days ago. Denies fever.  Mild right low back pain. Denies hematuria.    Observations/Objective:   Gen: Awake, alert, no acute distress Resp: Breathing is even and non-labored Psych: calm/pleasant demeanor Neuro: Alert and Oriented x 3, + facial symmetry, speech is clear.   Assessment and Plan:  UTI- symptoms most consistent with UTI. Advised pt to begin macrobid and to call if symptoms worsen or if symptoms fail to improve. Pt verbalizes understanding.  Follow Up Instructions:    I discussed the assessment and treatment plan with the patient. The patient was provided an opportunity to ask questions and all were answered. The patient agreed with the plan and demonstrated an understanding of the instructions.   The patient was advised to call back or seek an in-person evaluation if the symptoms worsen or if the condition fails to improve as anticipated.  Nance Pear, NP

## 2019-12-29 ENCOUNTER — Encounter: Payer: Self-pay | Admitting: Family

## 2020-01-27 ENCOUNTER — Other Ambulatory Visit: Payer: Self-pay | Admitting: Family

## 2020-03-18 ENCOUNTER — Telehealth: Payer: Self-pay | Admitting: Family

## 2020-03-18 DIAGNOSIS — M79672 Pain in left foot: Secondary | ICD-10-CM

## 2020-03-18 DIAGNOSIS — M79671 Pain in right foot: Secondary | ICD-10-CM

## 2020-03-18 NOTE — Telephone Encounter (Signed)
Caller: Maleta Call back # [phone]  Patient would like a referral to the foot specialist for bilateral heel pain.

## 2020-03-21 ENCOUNTER — Telehealth: Payer: Self-pay | Admitting: Family

## 2020-03-21 DIAGNOSIS — R160 Hepatomegaly, not elsewhere classified: Secondary | ICD-10-CM

## 2020-03-21 NOTE — Telephone Encounter (Signed)
Please contact pt and let her know that I have placed an order for a follow up MRI to evaluate the spot on her liver and make sure that it has not changed in size.  She should be contacted to schedule.

## 2020-04-06 ENCOUNTER — Other Ambulatory Visit: Payer: Self-pay

## 2020-04-06 MED ORDER — METOPROLOL SUCCINATE ER 50 MG PO TB24: ORAL_TABLET | ORAL | 0 refills | Status: DC

## 2020-07-11 ENCOUNTER — Other Ambulatory Visit: Payer: Self-pay | Admitting: Family

## 2020-07-11 ENCOUNTER — Ambulatory Visit: Payer: PRIVATE HEALTH INSURANCE | Admitting: Family

## 2020-07-11 DIAGNOSIS — Z0289 Encounter for other administrative examinations: Secondary | ICD-10-CM

## 2020-07-11 NOTE — Telephone Encounter (Signed)
Medication: amLODipine (NORVASC) 5 MG tablet [[id number]]    meloxicam (MOBIC) 7.5 MG tablet    Has the patient contacted their pharmacy? No. (If no, request that the patient contact the pharmacy for the refill.) (If yes, when and what did the pharmacy advise?)  Preferred Pharmacy (with phone number or street name): [facility] [phone]  [address] [place], [place] [address]  Agent: Please be advised that RX refills may take up to 3 business days. We ask that you follow-up with your pharmacy.

## 2020-07-13 MED ORDER — AMLODIPINE BESYLATE 5 MG PO TABS: 5.0000 mg | ORAL_TABLET | Freq: Every day | ORAL | 0 refills | Status: DC

## 2020-07-13 MED ORDER — MELOXICAM 7.5 MG PO TABS: 7.5000 mg | ORAL_TABLET | Freq: Every day | ORAL | 0 refills | Status: DC

## 2020-07-13 NOTE — Telephone Encounter (Signed)
rxs sent in and left detailed message that she will need to call for follow up visit before any more refills can be given

## 2020-07-13 NOTE — Addendum Note (Signed)
Addended by: Kem Boroughs D on: 07/13/2020 11:33 AM   Modules accepted: Orders

## 2020-07-29 ENCOUNTER — Ambulatory Visit: Payer: Self-pay | Admitting: Family

## 2020-08-10 ENCOUNTER — Other Ambulatory Visit: Payer: Self-pay | Admitting: Family

## 2020-08-30 ENCOUNTER — Ambulatory Visit: Payer: Self-pay | Admitting: Family

## 2020-08-30 DIAGNOSIS — Z0289 Encounter for other administrative examinations: Secondary | ICD-10-CM

## 2020-10-26 ENCOUNTER — Other Ambulatory Visit: Payer: Self-pay | Admitting: Family

## 2020-12-18 ENCOUNTER — Telehealth: Payer: Self-pay | Admitting: Family

## 2020-12-18 NOTE — Telephone Encounter (Signed)
See mychart.  

## 2021-01-05 ENCOUNTER — Telehealth: Payer: Self-pay

## 2021-01-05 MED ORDER — AMLODIPINE BESYLATE 5 MG PO TABS: 5.0000 mg | ORAL_TABLET | Freq: Every day | ORAL | 0 refills | Status: DC

## 2021-01-05 MED ORDER — METOPROLOL SUCCINATE ER 50 MG PO TB24: 50.0000 mg | ORAL_TABLET | Freq: Every day | ORAL | 0 refills | Status: DC

## 2021-01-05 NOTE — Telephone Encounter (Signed)
Pt needing refills on metoprolol XL 50mg  and amlodipine 5mg  to [facility] in [place]- I informed she is due for appt and offered to schedule an appt-states she just started a new job and insurance will become active on 02/03/21- she will call back after that to schedule. 30 day supply sent.

## 2021-02-01 ENCOUNTER — Other Ambulatory Visit: Payer: Self-pay | Admitting: Family

## 2021-03-01 ENCOUNTER — Encounter: Payer: Self-pay | Admitting: Family

## 2021-03-02 ENCOUNTER — Telehealth: Payer: Self-pay | Admitting: Family

## 2021-03-02 NOTE — Telephone Encounter (Signed)
Patient reports she was seen and prescribed antiviral medication and an inhaler. She will call back if she needs anything else or call back for follow up for follow up when she is better.

## 2021-03-02 NOTE — Telephone Encounter (Signed)
FYI  Patient would like for you to know she is covid + as of 03/02/21

## 2021-03-19 ENCOUNTER — Other Ambulatory Visit: Payer: Self-pay | Admitting: Family

## 2021-03-29 ENCOUNTER — Other Ambulatory Visit: Payer: Self-pay | Admitting: Family

## 2021-04-07 ENCOUNTER — Telehealth: Payer: Self-pay | Admitting: Family

## 2021-04-07 DIAGNOSIS — Z0000 Encounter for general adult medical examination without abnormal findings: Secondary | ICD-10-CM

## 2021-04-07 NOTE — Telephone Encounter (Signed)
Please contact pt and let her know that it looks like she never scheduled her diagnostic mammogram.  I am going to re-order and encourage her to complete.

## 2021-04-07 NOTE — Telephone Encounter (Signed)
Called but no answer, LVM for patient to call back

## 2021-04-14 ENCOUNTER — Other Ambulatory Visit: Payer: Self-pay

## 2021-04-14 ENCOUNTER — Ambulatory Visit: Payer: Commercial Managed Care - PPO | Admitting: Family

## 2021-04-14 MED ORDER — AMLODIPINE BESYLATE 5 MG PO TABS: 5.0000 mg | ORAL_TABLET | Freq: Every day | ORAL | 0 refills | Status: DC

## 2021-04-14 MED ORDER — METOPROLOL SUCCINATE ER 50 MG PO TB24: 50.0000 mg | ORAL_TABLET | Freq: Every day | ORAL | 0 refills | Status: DC

## 2021-04-14 NOTE — Progress Notes (Incomplete)
Subjective:   By signing my name below, I, Shehryar Baig, attest that this documentation has been prepared under the direction and in the presence of08/08/2018     Patient ID: Kristin Herrera, female    DOB: [birth year], 50 y.o.   MRN: [id number]  No chief complaint on file.   HPI Patient is in today for office visit  Health Maintenance Due  Topic Date Due   COVID-19 Vaccine (1) Never done   COLONOSCOPY (Pts 45-22yr Insurance coverage will need to be confirmed)  Never done   PAP SMEAR-Modifier  05/27/2018   MAMMOGRAM  01/31/2019   TETANUS/TDAP  08/28/2019   INFLUENZA VACCINE  03/27/2021    Past Medical History:  Diagnosis Date   Diverticulitis    Frequent episodic tension-type headache    Gestational diabetes    Hypertension    Leukocytosis 09/05/2016   Routine screening for STI (sexually transmitted infection) 09/05/2016    Past Surgical History:  Procedure Laterality Date   ABDOMINAL HYSTERECTOMY  09/2015   CESAREAN SECTION  1991, 1999   ROBOTIC ASSISTED TOTAL HYSTERECTOMY WITH SALPINGECTOMY Bilateral 09/29/2015   Procedure: ROBOTIC ASSISTED TOTAL HYSTERECTOMY WITH SALPINGECTOMY;  Surgeon: MPrincess Bruins MD;  [facility];  Service: Gynecology;  Laterality: Bilateral;   TUBAL LIGATION  1999    Family History  Problem Relation Age of Onset   Hypertension Mother    Heart disease Father        CAD, died from MI at age 49  Heart disease Paternal Uncle    Heart disease Paternal Grandfather    Hypertension Maternal Grandmother    Diabetes Maternal Grandmother    Stroke Paternal Grandmother    Hypertension Paternal Grandmother    Diabetes Paternal Grandmother    Diabetes Daughter     Social History   Socioeconomic History   Marital status: Single    Spouse name: Not on file   Number of children: 2   Years of education: Not on file   Highest education level: Not on file  Occupational History   Occupation: HOUSEHOLD CProgrammer, 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[employer]  Tobacco Use   Smoking status: Former    Years: 10.00    Types: Cigarettes    Quit date: 03/10/2010    Years since quitting: 11.1   Smokeless tobacco: Never  Substance and Sexual Activity   Alcohol use: No    Alcohol/week: 0.0 standard drinks   Drug use: No   Sexual activity: Yes    Partners: Male    Birth control/protection: Surgical  Other Topics Concern   Not on file  Social History Narrative   Caffeine Use:  1 daily   Regular exercise:  No   Married   2 children Son born 170and daughter born 1999   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 30 tablet 0   meloxicam (MOBIC) 7.5 MG tablet Take 1 tablet (7.5 mg total) by mouth daily. 14 tablet 0   metoprolol succinate (TOPROL-XL)  50 MG 24 hr tablet Take 1 tablet (50 mg total) by mouth daily. Take with or immediately following a meal. 30 tablet 0   nitrofurantoin, macrocrystal-monohydrate, (MACROBID) 100 MG capsule Take 1 capsule (100 mg total) by mouth 2 (two) times daily. 14 capsule 0   No facility-administered medications prior to visit.    Allergies  Allergen Reactions   Simvastatin Other (See Comments)    cramping    ROS     Objective:    Physical Exam  LMP 09/08/2015  Wt Readings from Last 3 Encounters:  10/30/19 199 lb (90.3 kg)  02/25/19 192 lb (87.1 kg)  02/13/19 194 lb 0.1 oz (88 kg)       Assessment & Plan:   Problem List Items Addressed This Visit   None   '@ENCMEDP'$ @  No orders of the defined types were placed in this encounter.   I, Shehryar [place],  personally preformed the services described in this documentation.  All medical record entries made by the scribe were at my direction and in my presence.  I have reviewed the chart and discharge instructions (if applicable) and agree that the record reflects my personal performance and is accurate and complete. '@ENCDATE'$ @     [employer]

## 2021-04-17 ENCOUNTER — Other Ambulatory Visit: Payer: Self-pay

## 2021-04-17 ENCOUNTER — Ambulatory Visit: Payer: Commercial Managed Care - PPO | Admitting: Family

## 2021-04-17 VITALS — BP 182/94 | HR 60 | Temp 98.7°F | Resp 16 | Ht 62.0 in | Wt 187.0 lb

## 2021-04-17 DIAGNOSIS — M722 Plantar fascial fibromatosis: Secondary | ICD-10-CM | POA: Insufficient documentation

## 2021-04-17 DIAGNOSIS — Z1211 Encounter for screening for malignant neoplasm of colon: Secondary | ICD-10-CM

## 2021-04-17 DIAGNOSIS — I10 Essential (primary) hypertension: Secondary | ICD-10-CM

## 2021-04-17 MED ORDER — AMLODIPINE BESYLATE 10 MG PO TABS: 10.0000 mg | ORAL_TABLET | Freq: Every day | ORAL | 0 refills | Status: DC

## 2021-04-17 NOTE — Assessment & Plan Note (Signed)
New.  Recommended, icing, short term use of prn ibuprofen.

## 2021-04-17 NOTE — Progress Notes (Signed)
Subjective:   By signing my name below, I, Shehryar Baig, attest that this documentation has been prepared under the direction and in the presence of Debbrah Alar NP. 04/17/2021     Patient ID: Kristin Herrera, female    DOB: [birth year], 50 y.o.   MRN: [id number]  Chief Complaint  Patient presents with   Hypertension    Here for follow up    HPI Patient is in today for a office visit.  Foot pain- She complains of bottom foot pain since she started her new job. She notes that she is on her feet for most of the day. She notes having foot pain previously in her heels and she thinks this may be related.  Blood pressure- Her blood pressure is elevated during this visit. She continues taking 5 mg amlodipine daily PO, 50 mg metoprolol succinate daily PO and reports no new issues while taking it.  BP Readings from Last 3 Encounters:  04/17/21 (!) 182/94  10/30/19 (!) 159/105  02/25/19 (!) 188/106   Urination- She reports having a decreased quantity of urine when she urinates. Diet- She notes that she eats outside from restaurants frequently. She is planning on cooking more food from home.  Colonoscopy- She is due for a colonoscopy and is interested in setting up an appointment for a later time.  GYN- She sees a GYN specialist to manage her pap smears. She also notes having a partial hysterectomy. Syphilis- She went to the health department to get treatment and had follow up appointments with them as well. Mammogram- She has an upcomming appointment for her mammogram soon. Immunizations- She is due for the tetanus vaccine. She reports having the Covid-19 vaccines at this time.    Health Maintenance Due  Topic Date Due   COLONOSCOPY (Pts 45-16yr Insurance coverage will need to be confirmed)  Never done   MAMMOGRAM  01/31/2019   TETANUS/TDAP  08/28/2019   INFLUENZA VACCINE  03/27/2021    Past Medical History:  Diagnosis Date   Diverticulitis    Frequent episodic tension-type  headache    Gestational diabetes    Hypertension    Leukocytosis 09/05/2016   Routine screening for STI (sexually transmitted infection) 09/05/2016    Past Surgical History:  Procedure Laterality Date   ABDOMINAL HYSTERECTOMY  09/2015   CESAREAN SECTION  1991, 1999   ROBOTIC ASSISTED TOTAL HYSTERECTOMY WITH SALPINGECTOMY Bilateral 09/29/2015   Procedure: ROBOTIC ASSISTED TOTAL HYSTERECTOMY WITH SALPINGECTOMY;  Surgeon: MPrincess Bruins MD;  [facility];  Service: Gynecology;  Laterality: Bilateral;   TUBAL LIGATION  1999    Family History  Problem Relation Age of Onset   Hypertension Mother    Heart disease Father        CAD, died from MI at age 49  Heart disease Paternal Uncle    Heart disease Paternal Grandfather    Hypertension Maternal Grandmother    Diabetes Maternal Grandmother    Stroke Paternal Grandmother    Hypertension Paternal Grandmother    Diabetes Paternal Grandmother    Diabetes Daughter     Social History   Socioeconomic History   Marital status: Single    Spouse name: Not on file   Number of children: 2   Years of education: Not on file   Highest education level: Not on file  Occupational History   Occupation: HOUSEHOLD CProgrammer, 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[employer]  Tobacco Use   Smoking status: Former    Years: 10.00  Types: Cigarettes    Quit date: 03/10/2010    Years since quitting: 11.1   Smokeless tobacco: Never  Substance and Sexual Activity   Alcohol use: No    Alcohol/week: 0.0 standard drinks   Drug use: No   Sexual activity: Yes    Partners: Male    Birth control/protection: Surgical  Other Topics Concern   Not on file  Social History Narrative   Caffeine Use:  1 daily   Regular exercise:  No   Married   2 children Son born 34 and daughter born 1999   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 Take with or immediately following a meal. 30 tablet 0   amLODipine (NORVASC) 5 MG tablet Take 1 tablet (5 mg total) by mouth daily. 30 tablet 0   meloxicam (MOBIC) 7.5 MG tablet Take 1 tablet (7.5 mg total) by mouth daily. 14 tablet 0   nitrofurantoin, macrocrystal-monohydrate, (MACROBID) 100 MG capsule Take 1 capsule (100 mg total) by mouth 2 (two) times daily. 14 capsule 0   No facility-administered medications prior to visit.    Allergies  Allergen Reactions   Simvastatin Other (See Comments)    cramping    Review of Systems  Musculoskeletal:  Positive for myalgias (bottom foot pain).      Objective:    Physical Exam Constitutional:      General: She is not in acute distress.    Appearance: Normal appearance. She is not ill-appearing.  HENT:     Head: Normocephalic and atraumatic.     Right Ear: External ear normal.     Left Ear: External ear normal.  Eyes:     Extraocular Movements: Extraocular movements intact.     Pupils: Pupils are equal, round, and reactive to light.  Cardiovascular:     Rate and Rhythm: Normal rate and regular rhythm.     Heart sounds: Normal heart sounds. No murmur heard.   No gallop.  Pulmonary:     Effort: Pulmonary effort is normal. No respiratory distress.     Breath sounds: Normal breath sounds. No wheezing or rales.  Skin:    General: Skin is warm and dry.  Neurological:     Mental Status: She is alert and oriented to person, place, and time.  Psychiatric:        Behavior: Behavior normal.    BP (!) 182/94 (BP Location: Right Arm, Patient Position: Sitting, Cuff Size: Small)   Pulse 60   Temp 98.7 F  (37.1 C) (Oral)   Resp 16   Ht '5\' 2"'$  (1.575 m)   Wt 187 lb (84.8 kg)   LMP 09/08/2015   SpO2 100%   BMI 34.20 kg/m  Wt Readings from Last 3 Encounters:  04/17/21 187 lb (84.8 kg)  10/30/19 199 lb (90.3 kg)  02/25/19 192 lb (87.1 kg)       Assessment & Plan:   Problem List Items Addressed This Visit       Unprioritized  Plantar fasciitis, bilateral    New.  Recommended, icing, short term use of prn ibuprofen.       HTN (hypertension)    BP Readings from Last 3 Encounters:  04/17/21 (!) 182/94  10/30/19 (!) 159/105  02/25/19 (!) 188/106  Uncontrolled. Will increase amlodipine to '10mg'$  once daily.  Continue metoprolol '50mg'$ .  Pt is due for labs- our lab is very backed up today- will plan labs at her follow up in 2 weeks.       Relevant Medications   amLODipine (NORVASC) 10 MG tablet   Other Visit Diagnoses     Colon cancer screening    -  Primary   Relevant Orders   Ambulatory referral to Gastroenterology        Meds ordered this encounter  Medications   amLODipine (NORVASC) 10 MG tablet    Sig: Take 1 tablet (10 mg total) by mouth daily.    Dispense:  90 tablet    Refill:  0    Order Specific Question:   Supervising Provider    Answer:   Penni Homans A [[id number]]    I, Debbrah Alar NP, personally preformed the services described in this documentation.  All medical record entries made by the scribe were at my direction and in my presence.  I have reviewed the chart and discharge instructions (if applicable) and agree that the record reflects my personal performance and is accurate and complete. 04/17/2021   I,Shehryar Baig,acting as a scribe for Nance Pear, NP.,have documented all relevant documentation on the behalf of Nance Pear, NP,as directed by  Nance Pear, NP while in the presence of Nance Pear, NP.   Nance Pear, NP

## 2021-04-17 NOTE — Assessment & Plan Note (Addendum)
BP Readings from Last 3 Encounters:  04/17/21 (!) 182/94  10/30/19 (!) 159/105  02/25/19 (!) 188/106   Uncontrolled. Will increase amlodipine to '10mg'$  once daily.  Continue metoprolol '50mg'$ .  Pt is due for labs- our lab is very backed up today- will plan labs at her follow up in 2 weeks.

## 2021-04-17 NOTE — Patient Instructions (Signed)
Please increase amlodipine to 10mg once daily.  

## 2021-04-24 ENCOUNTER — Other Ambulatory Visit: Payer: Self-pay | Admitting: Family

## 2021-04-24 DIAGNOSIS — R921 Mammographic calcification found on diagnostic imaging of breast: Secondary | ICD-10-CM

## 2021-05-02 ENCOUNTER — Other Ambulatory Visit: Payer: Self-pay | Admitting: Family

## 2021-05-02 ENCOUNTER — Other Ambulatory Visit: Payer: Self-pay

## 2021-05-02 ENCOUNTER — Ambulatory Visit
Admission: RE | Admit: 2021-05-02 | Discharge: 2021-05-02 | Disposition: A | Discharge status: Home / Self Care | Payer: Commercial Managed Care - PPO | Source: Ambulatory Visit | Attending: Family | Admitting: Family

## 2021-05-02 DIAGNOSIS — R921 Mammographic calcification found on diagnostic imaging of breast: Secondary | ICD-10-CM

## 2021-05-09 ENCOUNTER — Encounter: Payer: Commercial Managed Care - PPO | Admitting: Family

## 2021-05-09 NOTE — Progress Notes (Incomplete)
Subjective:   By signing my name below, I, Lyric Barr-McArthur, attest that this documentation has been prepared under the direction and in the presence of Debbrah Alar, NP, 05/09/2021   Patient ID: Kristin Herrera, female    DOB: 1971-05-18, 50 y.o.   MRN: [id number]  No chief complaint on file.   HPI Patient is in today for a comprehensive physical exam.   She denies having any unexpected weight change, ear pain, hearing loss and rhinorrhea, visual disturbance, cough, chest pain and leg swelling, nausea, vomiting, diarrhea and blood in stool, or dysuria and frequency, for myalgias and arthralgias, rash, headaches, adenopathy, depression or anxiety at this time.  Health Maintenance Due  Topic Date Due   COLONOSCOPY (Pts 45-42yr Insurance coverage will need to be confirmed)  Never done   TETANUS/TDAP  08/28/2019   INFLUENZA VACCINE  03/27/2021    Past Medical History:  Diagnosis Date   Diverticulitis    Frequent episodic tension-type headache    Gestational diabetes    Hypertension    Leukocytosis 09/05/2016   Routine screening for STI (sexually transmitted infection) 09/05/2016    Past Surgical History:  Procedure Laterality Date   ABDOMINAL HYSTERECTOMY  09/2015   CESAREAN SECTION  1991, 1999   ROBOTIC ASSISTED TOTAL HYSTERECTOMY WITH SALPINGECTOMY Bilateral 09/29/2015   Procedure: ROBOTIC ASSISTED TOTAL HYSTERECTOMY WITH SALPINGECTOMY;  Surgeon: MPrincess Bruins MD;  Location[facility];  Service: Gynecology;  Laterality: Bilateral;   TUBAL LIGATION  1999    Family History  Problem Relation Age of Onset   Hypertension Mother    Heart disease Father        CAD, died from MI at age 49  Heart disease Paternal Uncle    Heart disease Paternal Grandfather    Hypertension Maternal Grandmother    Diabetes Maternal Grandmother    Stroke Paternal Grandmother    Hypertension Paternal Grandmother    Diabetes Paternal Grandmother    Diabetes Daughter     Social  History   Socioeconomic History   Marital status: Single    Spouse name: Not on file   Number of children: 2   Years of education: Not on file   Highest education level: Not on file  Occupational History   Occupation: HOUSEHOLD CProgrammer, 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  Tobacco Use   Smoking status: Former    Years: 10.00    Types: Cigarettes    Quit date: 03/10/2010    Years since quitting: 11.1   Smokeless tobacco: Never  Substance and Sexual Activity   Alcohol use: No    Alcohol/week: 0.0 standard drinks   Drug use: No   Sexual activity: Yes    Partners: Male    Birth control/protection: Surgical  Other Topics Concern   Not on file  Social History Narrative   Caffeine Use:  1 daily   Regular exercise:  No   Married   2 children Son born 164and daughter born 1999   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 90 tablet 0   metoprolol succinate (TOPROL-XL) 50 MG 24 hr tablet Take 1 tablet (50 mg total) by mouth daily. Take with or immediately following a meal. 30 tablet 0   No facility-administered medications prior to visit.    Allergies  Allergen Reactions   Simvastatin Other (See Comments)    cramping    ROS     Objective:    Physical Exam Constitutional:      General: She is not in acute distress.    Appearance: Normal appearance. She is not ill-appearing.  HENT:     Head: Normocephalic and atraumatic.     Right Ear: Tympanic membrane, ear canal and external ear normal.     Left Ear: Tympanic membrane, ear canal and  external ear normal.  Eyes:     Extraocular Movements: Extraocular movements intact.     Pupils: Pupils are equal, round, and reactive to light.     Comments: (-) nystagmus   Cardiovascular:     Rate and Rhythm: Normal rate and regular rhythm.     Heart sounds: Normal heart sounds. No murmur heard.   No gallop.  Pulmonary:     Effort: Pulmonary effort is normal. No respiratory distress.     Breath sounds: Normal breath sounds. No wheezing or rales.  Musculoskeletal:     Comments: (+) 5/5 upper and lower extremity strength  Lymphadenopathy:     Cervical: No cervical adenopathy.  Skin:    General: Skin is warm and dry.  Neurological:     Mental Status: She is alert and oriented to person, place, and time.  Psychiatric:        Behavior: Behavior normal.        Judgment: Judgment normal.    LMP 09/08/2015  Wt Readings from Last 3 Encounters:  04/17/21 187 lb (84.8 kg)  10/30/19 199 lb (90.3 kg)  02/25/19 192 lb (87.1 kg)       Assessment & Plan:   Problem List Items Addressed This Visit   None   No orders of the defined types were placed in this encounter.   I, Debbrah Alar, NP, personally preformed the services described in this documentation.  All medical record entries made by the scribe were at my direction and in my presence.  I have reviewed the chart and discharge instructions (if applicable) and agree that the record reflects my personal performance and is accurate and complete. 05/09/2021   I,Lyric Barr-McArthur,acting as a Education administrator for Nance Pear, NP.,have documented all relevant documentation on the behalf of Nance Pear, NP,as directed by  Nance Pear, NP while in the presence of Nance Pear, NP.    Lyric Barr-McArthur

## 2021-05-10 ENCOUNTER — Ambulatory Visit
Admission: RE | Admit: 2021-05-10 | Discharge: 2021-05-10 | Disposition: A | Discharge status: Home / Self Care | Payer: Commercial Managed Care - PPO | Source: Ambulatory Visit | Attending: Family | Admitting: Family

## 2021-05-10 ENCOUNTER — Other Ambulatory Visit: Payer: Self-pay

## 2021-05-10 ENCOUNTER — Other Ambulatory Visit: Payer: Self-pay | Admitting: Body Imaging

## 2021-05-10 DIAGNOSIS — R921 Mammographic calcification found on diagnostic imaging of breast: Secondary | ICD-10-CM

## 2021-05-13 ENCOUNTER — Other Ambulatory Visit: Payer: Self-pay | Admitting: Family

## 2021-05-14 ENCOUNTER — Other Ambulatory Visit: Payer: Self-pay | Admitting: Family

## 2021-05-15 MED ORDER — AMLODIPINE BESYLATE 10 MG PO TABS: 10.0000 mg | ORAL_TABLET | Freq: Every day | ORAL | 1 refills | Status: DC

## 2021-07-10 ENCOUNTER — Encounter: Payer: Commercial Managed Care - PPO | Admitting: Gastroenterology

## 2021-12-30 IMAGING — MG MM BREAST BX W/ LOC DEV 1ST LESION IMAGE BX SPEC STEREO GUIDE*R*
8 of 11 series · 8 of 27 positions shown · non-contrast
Comparison: Previous exams.
COMPARISON: Previous exams.

Addendum:
CLINICAL DATA: 49-year-old female presenting for stereotactic
biopsy of right breast calcifications.

EXAM:
RIGHT BREAST STEREOTACTIC CORE NEEDLE BIOPSY

[R (1 of 6)]
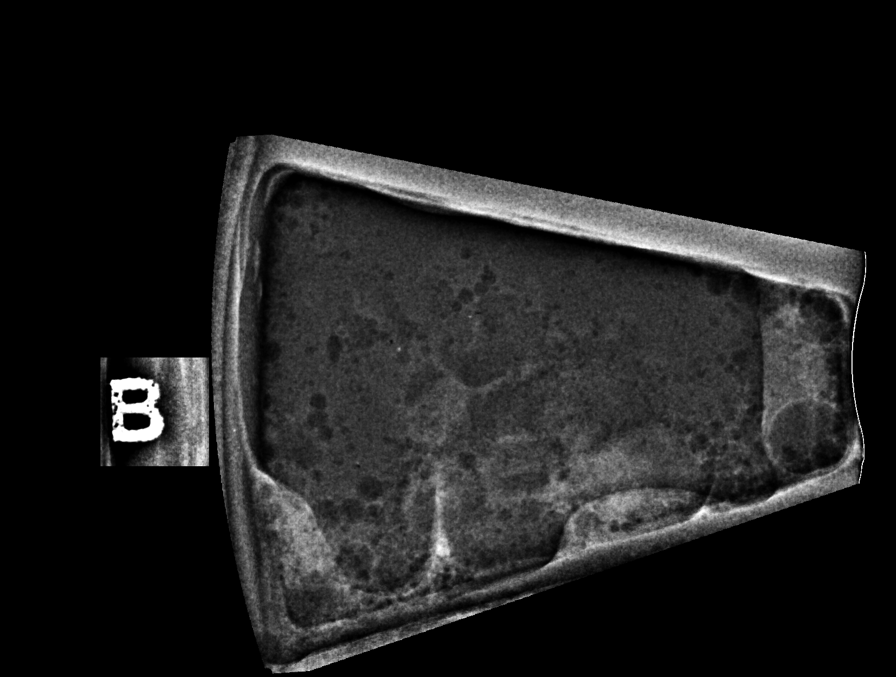

[R (2 of 6)]
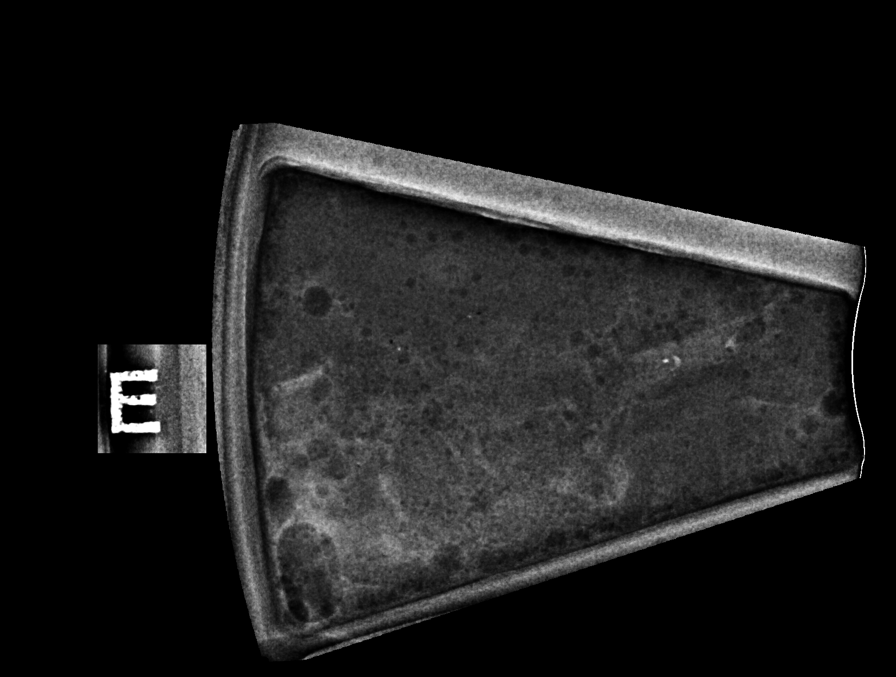

[R (3 of 6)]
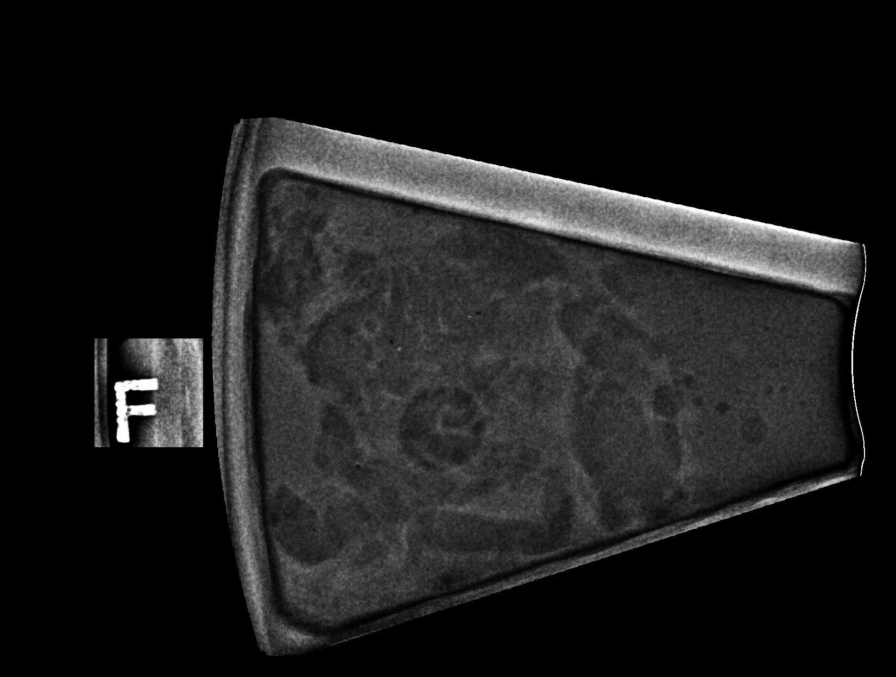

[R (4 of 6)]
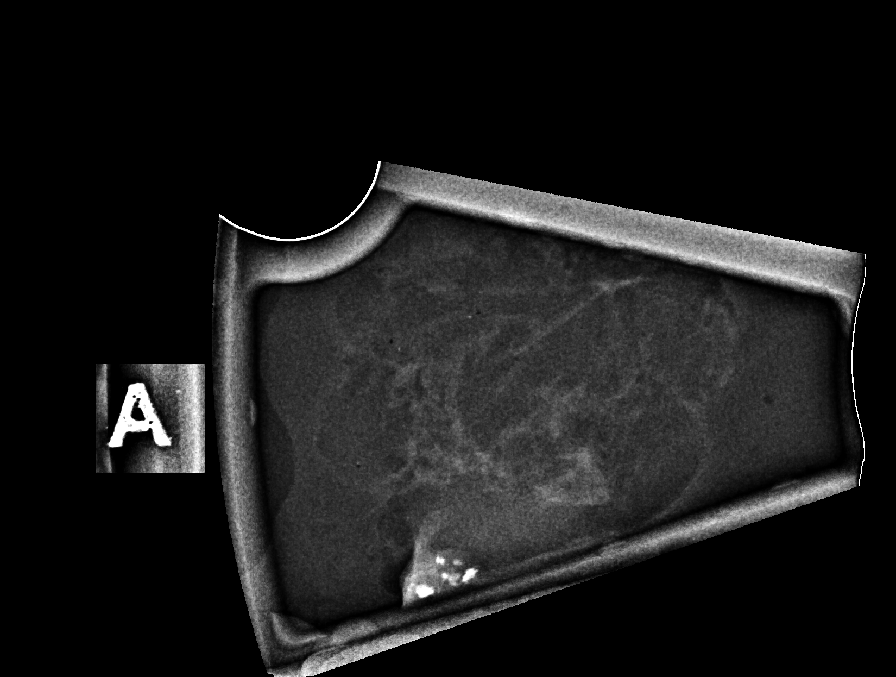

[R (5 of 6)]
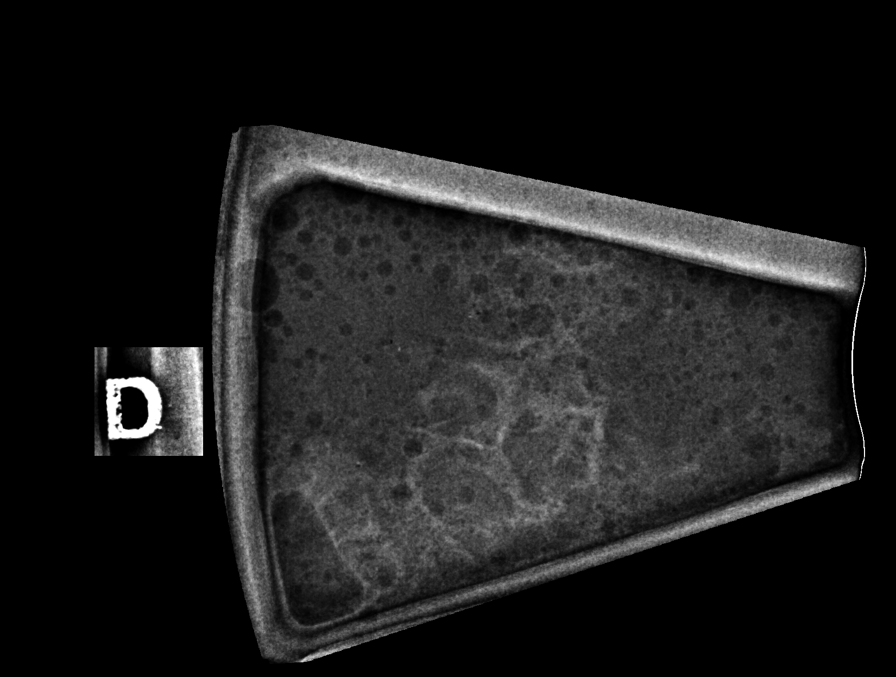

[R (6 of 6)]
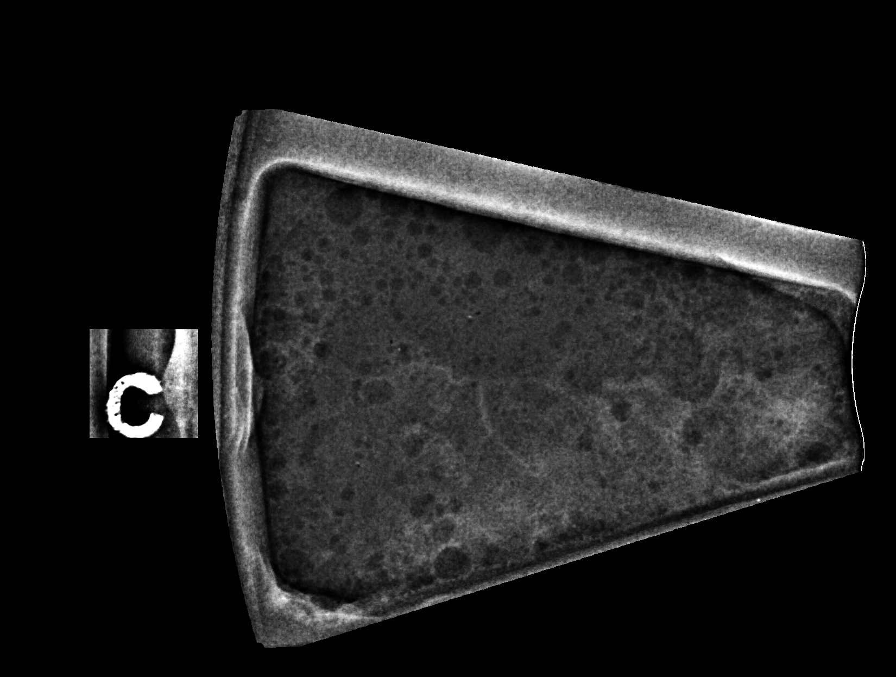

[R LM]
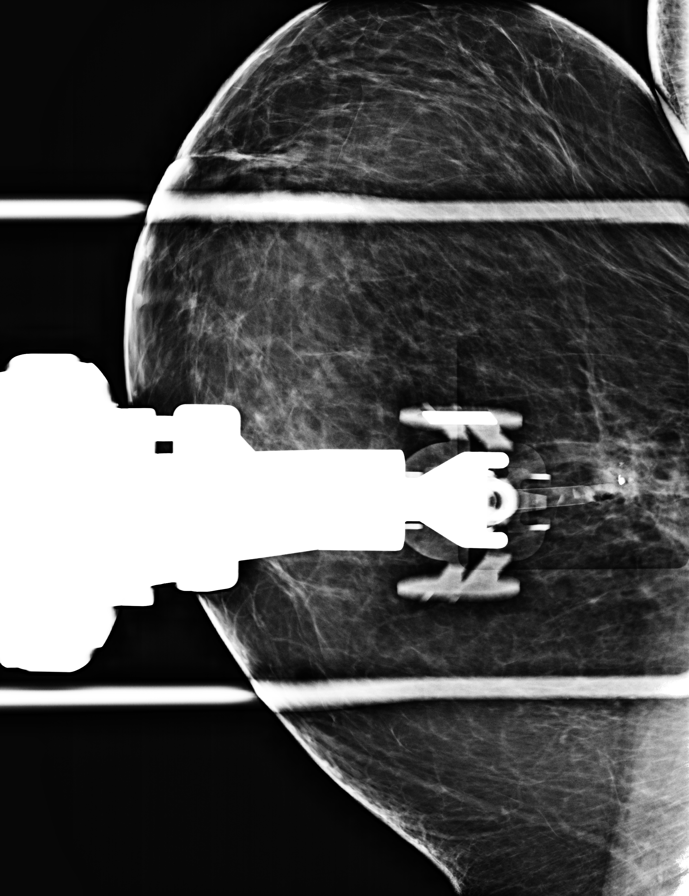

[R LM tomo · tomo slice 37/73.0]
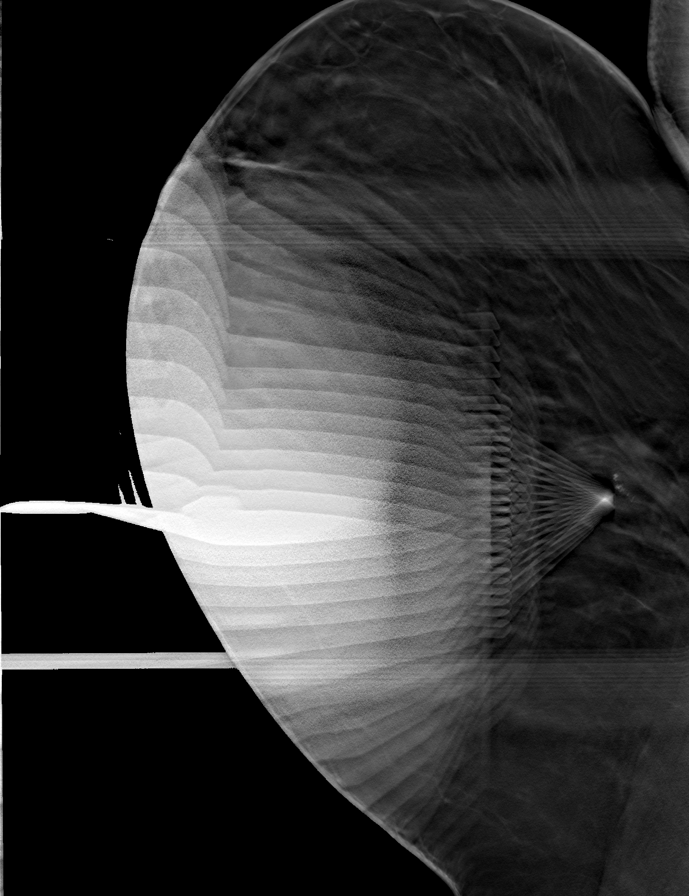

[8 of 27 positions shown; findings below may reference images not displayed]



Using sterile technique and 1% Lidocaine as local anesthetic, under
stereotactic guidance, a 9 gauge vacuum assisted device was used to
perform core needle biopsy of calcifications in the upper-outer
quadrant of the right breast using a lateral approach. Specimen
radiograph was performed showing calcifications in at least 2 core
samples. Specimens with calcifications are identified for pathology.

Lesion quadrant: Upper outer quadrant

At the conclusion of the procedure, a coil shaped tissue marker clip
was deployed into the biopsy cavity. Follow-up 2-view mammogram was
performed and dictated separately.
IMPRESSION: Stereotactic-guided biopsy of calcifications in the upper-outer
right breast. No apparent complications.

ADDENDUM:
Pathology revealed FIBROADENOMATOID CHANGE WITH MICROCALCIFICATIONS
of the RIGHT breast, upper outer, coil clip. This was found to be
concordant by Dr. Nolawit Muhye.

Pathology results were discussed with the patient by telephone. The
patient reported doing well after the biopsy with tenderness at the
site. Post biopsy instructions and care were reviewed and questions
were answered. The patient was encouraged to call The [REDACTED]

The patient was instructed to return for annual screening
mammography and informed a reminder notice would be sent regarding
this appointment.

Pathology results reported by Rodrick Jumper RN on 05/11/2021.



Using sterile technique and 1% Lidocaine as local anesthetic, under
stereotactic guidance, a 9 gauge vacuum assisted device was used to
perform core needle biopsy of calcifications in the upper-outer
quadrant of the right breast using a lateral approach. Specimen
radiograph was performed showing calcifications in at least 2 core
samples. Specimens with calcifications are identified for pathology.

Lesion quadrant: Upper outer quadrant

At the conclusion of the procedure, a coil shaped tissue marker clip
was deployed into the biopsy cavity. Follow-up 2-view mammogram was
performed and dictated separately.
IMPRESSION: Stereotactic-guided biopsy of calcifications in the upper-outer
right breast. No apparent complications.

## 2022-01-19 ENCOUNTER — Telehealth: Payer: Self-pay | Admitting: Family

## 2022-01-19 NOTE — Telephone Encounter (Signed)
Please contact pt to schedule a follow up visit with me.

## 2022-01-19 NOTE — Telephone Encounter (Signed)
Vm left to call back and schedule.

## 2022-03-23 ENCOUNTER — Ambulatory Visit (INDEPENDENT_AMBULATORY_CARE_PROVIDER_SITE_OTHER): Payer: No Typology Code available for payment source | Admitting: Family

## 2022-03-23 ENCOUNTER — Encounter: Payer: Self-pay | Admitting: Family

## 2022-03-23 VITALS — BP 137/90 | HR 61 | Temp 98.6°F | Resp 16 | Ht 62.0 in | Wt 186.0 lb

## 2022-03-23 DIAGNOSIS — I10 Essential (primary) hypertension: Secondary | ICD-10-CM | POA: Diagnosis not present

## 2022-03-23 DIAGNOSIS — Z1231 Encounter for screening mammogram for malignant neoplasm of breast: Secondary | ICD-10-CM

## 2022-03-23 DIAGNOSIS — R7303 Prediabetes: Secondary | ICD-10-CM | POA: Diagnosis not present

## 2022-03-23 DIAGNOSIS — Z0000 Encounter for general adult medical examination without abnormal findings: Secondary | ICD-10-CM | POA: Diagnosis not present

## 2022-03-23 DIAGNOSIS — F32A Depression, unspecified: Secondary | ICD-10-CM | POA: Insufficient documentation

## 2022-03-23 DIAGNOSIS — D509 Iron deficiency anemia, unspecified: Secondary | ICD-10-CM

## 2022-03-23 DIAGNOSIS — Z1211 Encounter for screening for malignant neoplasm of colon: Secondary | ICD-10-CM

## 2022-03-23 NOTE — Assessment & Plan Note (Signed)
Lab Results  Component Value Date   HGBA1C 6.1 09/14/2015   Had A1C at [facility] and A1C was 5.8.

## 2022-03-23 NOTE — Assessment & Plan Note (Addendum)
Wt Readings from Last 3 Encounters:  03/23/22 186 lb (84.4 kg)  04/17/21 187 lb (84.8 kg)  10/30/19 199 lb (90.3 kg)   Continues healthy diet/exercise.  Refer for colo, mammo.  Recommended flu shot and covid booster this fall.  Recommended that she add otc b12 supplement for low normal b12 level.

## 2022-03-23 NOTE — Assessment & Plan Note (Signed)
Seeing psychiatry.  Will begin Wellbutrin.

## 2022-03-23 NOTE — Assessment & Plan Note (Signed)
Had normal CBC at [facility].

## 2022-03-23 NOTE — Assessment & Plan Note (Signed)
BP Readings from Last 3 Encounters:  03/23/22 137/90  04/17/21 (!) 182/94  10/30/19 (!) 159/105   Fair control. Continue amlodipine/metoprolol.

## 2022-03-23 NOTE — Progress Notes (Signed)
Subjective:   By signing my name below, I, [name], attest that this documentation has been prepared under the direction and in the presence of Karie Chimera, NP 03/23/2022   Patient ID: Kristin Herrera, female    DOB: 15-Sep-1970, 51 y.o.   MRN: [id number]  Chief Complaint  Patient presents with   Annual Exam    HPI Patient is in today for a comprehensive physical exam.  Vitamin B-12- Her vitamin B-12 levels are low. She is currently not taking Vitamin B-12 supplements Blood Pressure - She reports that she has not taken her medications yet. She is currently taking 10 mg of Amlodipine and 50 Mg of of Metoprolol Succinate.  BP Readings from Last 3 Encounters:  03/23/22 137/90  04/17/21 (!) 182/94  10/30/19 (!) 159/105   Pulse Readings from Last 3 Encounters:  03/23/22 61  04/17/21 60  10/30/19 60   Blood Sugar - She reports that her current A1C levels are around 5.8.  Constipation - She reports that she strains when producing a bowel movement.  Joint Pain - She has occasional joint pain. She has a cyst in her left wrist but symptoms do not regularly bother her. Depression - She states that she has been more depressed. She reports that she is regularly seeing a specialist and will start Wellbutrin  She denies having any fever, new muscle pain, new moles, congestion, sinus pain, sore throat, palpations, cough, SOB ,wheezing,n/v/d, blood in stool, dysuria, frequency, hematuria, anxiety, headaches at this time  Social history: She reports no recent surgeries. She states that her daughter has type II diabetes. She currently has three sisters and two brothers. Her oldest brother has condition involving ulcers. Her second oldest sister has type II diabetes. Her youngest sister has heart issues.  Colonoscopy: She is interested in being referred to receive a colonoscopy Mammogram: Last completed on 05/02/2021 Immunizations: She reports that she received her tetanus vaccine in  12/2021.  Diet: She is maintaining a healthy diet.  Exercise: She is regularly exercising. She enjoys walking.  Dental: She is UTD on dental exams Vision: She is UTD on vision exams   Health Maintenance Due  Topic Date Due   COLONOSCOPY (Pts 45-70yr Insurance coverage will need to be confirmed)  Never done   TETANUS/TDAP  08/28/2019   COVID-19 Vaccine (4 - Moderna series) 10/05/2020    Past Medical History:  Diagnosis Date   Diverticulitis    Frequent episodic tension-type headache    Gestational diabetes    Hypertension    Leukocytosis 09/05/2016   Routine screening for STI (sexually transmitted infection) 09/05/2016    Past Surgical History:  Procedure Laterality Date   ABDOMINAL HYSTERECTOMY  09/2015   CESAREAN SECTION  1991, 1999   ROBOTIC ASSISTED TOTAL HYSTERECTOMY WITH SALPINGECTOMY Bilateral 09/29/2015   Procedure: ROBOTIC ASSISTED TOTAL HYSTERECTOMY WITH SALPINGECTOMY;  Surgeon: MPrincess Bruins MD;  [facility];  Service: Gynecology;  Laterality: Bilateral;   TUBAL LIGATION  1999    Family History  Problem Relation Age of Onset   Hypertension Mother    Heart disease Father        CAD, died from MI at age 50  Diabetes type II Sister    Peptic Ulcer Brother    Hypertension Maternal Grandmother    Diabetes Maternal Grandmother    Stroke Paternal Grandmother    Hypertension Paternal Grandmother    Diabetes Paternal Grandmother    Heart disease Paternal Grandfather    Diabetes  Daughter        type 2   Heart disease Paternal Uncle     Social History   Socioeconomic History   Marital status: Single    Spouse name: Not on file   Number of children: 2   Years of education: Not on file   Highest education level: Not on file  Occupational History   Occupation: HOUSEHOLD COORDINATOR    Employer: [employer]  Tobacco Use   Smoking status: Former    Years: 10.00    Types: Cigarettes    Quit date: 03/10/2010    Years since quitting: 12.0    Smokeless tobacco: Never  Substance and Sexual Activity   Alcohol use: No    Alcohol/week: 0.0 standard drinks of alcohol   Drug use: No   Sexual activity: Yes    Partners: Male    Birth control/protection: Surgical  Other Topics Concern   Not on file  Social History Narrative   Caffeine Use:  1 daily   Regular exercise:  No   Divorced   2 children Son born 29 and daughter born 1999   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 90 tablet 1   metoprolol succinate (TOPROL-XL) 50 MG 24 hr tablet TAKE 1 TABLET(50 MG) BY MOUTH DAILY WITH OR IMMEDIATELY FOLLOWING A MEAL 90 tablet 1   No facility-administered medications prior to visit.    Allergies  Allergen Reactions   Simvastatin Other (See Comments)    cramping    Review of Systems  Constitutional:  Negative for fever.  HENT:  Negative for congestion, sinus pain and sore throat.   Respiratory:  Negative for cough, shortness of breath and wheezing.   Cardiovascular:  Negative for palpitations.  Gastrointestinal:  Positive for constipation. Negative for blood in stool, diarrhea, nausea and vomiting.  Genitourinary:  Negative for dysuria, frequency and hematuria.  Musculoskeletal:  Positive for joint pain. Negative for myalgias.  Skin:        (-) [place]  Neurological:  Negative for headaches.  Psychiatric/Behavioral:  Positive for depression. The patient is not nervous/anxious.        Objective:    Physical Exam Constitutional:      General: She is not in acute distress.     Appearance: Normal appearance. She is not ill-appearing.  HENT:     Head: Normocephalic and atraumatic.     Right Ear: Tympanic membrane, ear canal and external ear normal.     Left Ear: Tympanic membrane, ear canal and external ear normal.     Mouth/Throat:     Pharynx: No oropharyngeal exudate.  Eyes:     Extraocular Movements: Extraocular movements intact.     Pupils: Pupils are equal, round, and reactive to light.  Neck:     Thyroid: No thyromegaly.  Cardiovascular:     Rate and Rhythm: Normal rate and regular rhythm.     Heart sounds: Normal heart sounds. No murmur heard.    No gallop.  Pulmonary:  Effort: Pulmonary effort is normal. No respiratory distress.     Breath sounds: Normal breath sounds. No wheezing or rales.  Chest:  Breasts:    Breasts are symmetrical.     Right: No inverted nipple or mass.     Left: No inverted nipple or mass.  Abdominal:     General: Bowel sounds are normal. There is no distension.     Palpations: Abdomen is soft.     Tenderness: There is no abdominal tenderness. There is no guarding.  Musculoskeletal:     Comments: 5/5 strength in both upper and lower extremities    Lymphadenopathy:     Cervical: No cervical adenopathy.  Skin:    General: Skin is warm and dry.  Neurological:     Mental Status: She is alert and oriented to person, place, and time.     Deep Tendon Reflexes:     Reflex Scores:      Patellar reflexes are 2+ on the right side and 2+ on the left side. Psychiatric:        Mood and Affect: Mood normal.        Behavior: Behavior normal.        Judgment: Judgment normal.     BP 137/90 (BP Location: Right Arm, Patient Position: Sitting, Cuff Size: Small)   Pulse 61   Temp 98.6 F (37 C) (Oral)   Resp 16   Ht '5\' 2"'$  (1.575 m)   Wt 186 lb (84.4 kg)   LMP 09/08/2015   SpO2 100%   BMI 34.02 kg/m  Wt Readings from Last 3 Encounters:  03/23/22 186 lb (84.4 kg)  04/17/21 187 lb (84.8 kg)  10/30/19 199 lb (90.3 kg)        Assessment & Plan:   Problem List Items Addressed This Visit       Unprioritized   Preventative health care - Primary    Wt Readings from Last 3 Encounters:  03/23/22 186 lb (84.4 kg)  04/17/21 187 lb (84.8 kg)  10/30/19 199 lb (90.3 kg)  Continues healthy diet/exercise.  Refer for colo, mammo.  Recommended flu shot and covid booster this fall.  Recommended that she add otc b12 supplement for low normal b12 level.      HTN (hypertension)    BP Readings from Last 3 Encounters:  03/23/22 137/90  04/17/21 (!) 182/94  10/30/19 (!) 159/105  Fair control. Continue amlodipine/metoprolol.       Depression    Seeing psychiatry.  Will begin Wellbutrin.       Borderline diabetes    Lab Results  Component Value Date   HGBA1C 6.1 09/14/2015  Had A1C at [facility] and A1C was 5.8.      Anemia, iron deficiency    Had normal CBC at [facility].       Other Visit Diagnoses     Colon cancer screening       Relevant Orders   Ambulatory referral to Gastroenterology   Encounter for screening mammogram for malignant neoplasm of breast       Relevant Orders   MM 3D SCREEN BREAST BILATERAL        No orders of the defined types were placed in this encounter.   I, Nance Pear, NP, personally preformed the services described in this documentation.  All medical record entries made by the scribe were at my direction and in my presence.  I have reviewed the chart and discharge instructions (if applicable) and agree that the record  reflects my personal performance and is accurate and complete. 03/23/2022   I,Amber Collins,acting as a scribe for Nance Pear, NP.,have documented all relevant documentation on the behalf of Nance Pear, NP,as directed by  Nance Pear, NP while in the presence of Nance Pear, NP.    Nance Pear, NP

## 2022-04-04 ENCOUNTER — Telehealth: Payer: Self-pay | Admitting: Family

## 2022-04-04 NOTE — Telephone Encounter (Signed)
Pt called stating that her back pain has returned from a couple of years ago and is looking to see about having flexeril sent in to provide relief. Advised pt that there may be new issues that may cause the pain to return and Melissa may need to see her. Pt still wanted message sent back since she was seen by Melissa not too long ago.

## 2022-04-11 ENCOUNTER — Other Ambulatory Visit: Payer: Self-pay

## 2022-04-11 MED ORDER — METOPROLOL SUCCINATE ER 50 MG PO TB24
ORAL_TABLET | ORAL | 1 refills | Status: AC
Start: 2022-04-11 — End: ?

## 2022-04-11 MED ORDER — AMLODIPINE BESYLATE 10 MG PO TABS: 10.0000 mg | ORAL_TABLET | Freq: Every day | ORAL | 1 refills | Status: DC

## 2022-05-04 ENCOUNTER — Inpatient Hospital Stay: Admission: RE | Admit: 2022-05-04 | Payer: No Typology Code available for payment source | Source: Ambulatory Visit

## 2022-05-11 ENCOUNTER — Ambulatory Visit (INDEPENDENT_AMBULATORY_CARE_PROVIDER_SITE_OTHER): Payer: No Typology Code available for payment source | Admitting: Behavioral Health

## 2022-05-11 DIAGNOSIS — F4323 Adjustment disorder with mixed anxiety and depressed mood: Secondary | ICD-10-CM

## 2022-05-11 NOTE — Progress Notes (Unsigned)
                Kristin Herrera Kristin Reno Clasby, LMFT 
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
## 2022-06-01 ENCOUNTER — Ambulatory Visit: Payer: No Typology Code available for payment source | Admitting: Behavioral Health

## 2022-06-01 ENCOUNTER — Ambulatory Visit: Payer: PRIVATE HEALTH INSURANCE

## 2022-06-01 NOTE — Progress Notes (Unsigned)
                Tu Shimmel L Tomeka Kantner, LMFT 

## 2022-07-24 ENCOUNTER — Telehealth: Payer: No Typology Code available for payment source | Admitting: Physician Assistant

## 2022-07-24 DIAGNOSIS — B9689 Other specified bacterial agents as the cause of diseases classified elsewhere: Secondary | ICD-10-CM | POA: Diagnosis not present

## 2022-07-24 DIAGNOSIS — J019 Acute sinusitis, unspecified: Secondary | ICD-10-CM | POA: Diagnosis not present

## 2022-07-24 MED ORDER — AMOXICILLIN-POT CLAVULANATE 875-125 MG PO TABS: 1.0000 | ORAL_TABLET | Freq: Two times a day (BID) | ORAL | 0 refills | Status: AC

## 2022-07-24 MED ORDER — FLUTICASONE PROPIONATE 50 MCG/ACT NA SUSP: 2.0000 | Freq: Every day | NASAL | 0 refills | Status: AC

## 2022-07-24 NOTE — Patient Instructions (Signed)
Kristin Herrera, thank you for joining [name], PA-C for today's virtual visit.  While this provider is not your primary care provider (PCP), if your PCP is located in our provider database this encounter information will be shared with them immediately following your visit.   [facility] account gives you access to today's visit and all your visits, tests, and labs performed at [facility] " click here if you don't have a [facility] account or go to [email or web address]  Consent: (Patient) Kristin Herrera provided verbal consent for this virtual visit at the beginning of the encounter.  Current Medications:  Current Outpatient Medications:    amLODipine (NORVASC) 10 MG tablet, Take 1 tablet (10 mg total) by mouth daily., Disp: 90 tablet, Rfl: 1   metoprolol succinate (TOPROL-XL) 50 MG 24 hr tablet, TAKE 1 TABLET(50 MG) BY MOUTH DAILY WITH OR IMMEDIATELY FOLLOWING A MEAL, Disp: 90 tablet, Rfl: 1   Medications ordered in this encounter:  No orders of the defined types were placed in this encounter.    *If you need refills on other medications prior to your next appointment, please contact your pharmacy*  Follow-Up: Call back or seek an in-person evaluation if the symptoms worsen or if the condition fails to improve as anticipated.  [place] [phone]  Other Instructions Please take antibiotic as directed.  Increase fluid intake.  Use Saline nasal spray.  Take a daily multivitamin. Can use plain Mucinex or start Coricidin HBP over-the-counter. Use the Flonase as directed.  Place a humidifier in the bedroom.  Please call or return clinic if symptoms are not improving.  Sinusitis Sinusitis is redness, soreness, and swelling (inflammation) of the paranasal sinuses. Paranasal sinuses are air pockets within the bones of your face (beneath the eyes, the middle of the forehead, or above the eyes). In healthy paranasal sinuses,  mucus is able to drain out, and air is able to circulate through them by way of your nose. However, when your paranasal sinuses are inflamed, mucus and air can become trapped. This can allow bacteria and other germs to grow and cause infection. Sinusitis can develop quickly and last only a short time (acute) or continue over a long period (chronic). Sinusitis that lasts for more than 12 weeks is considered chronic.  CAUSES  Causes of sinusitis include: Allergies. Structural abnormalities, such as displacement of the cartilage that separates your nostrils (deviated septum), which can decrease the air flow through your nose and sinuses and affect sinus drainage. Functional abnormalities, such as when the small hairs (cilia) that line your sinuses and help remove mucus do not work properly or are not present. SYMPTOMS  Symptoms of acute and chronic sinusitis are the same. The primary symptoms are pain and pressure around the affected sinuses. Other symptoms include: Upper toothache. Earache. Headache. Bad breath. Decreased sense of smell and taste. A cough, which worsens when you are lying flat. Fatigue. Fever. Thick drainage from your nose, which often is green and may contain pus (purulent). Swelling and warmth over the affected sinuses. DIAGNOSIS  Your caregiver will perform a physical exam. During the exam, your caregiver may: Look in your nose for signs of abnormal growths in your nostrils (nasal polyps). Tap over the affected sinus to check for signs of infection. View the inside of your sinuses (endoscopy) with a special imaging device with a light attached (endoscope), which is inserted into your sinuses. If your caregiver suspects that you have chronic  sinusitis, one or more of the following tests may be recommended: Allergy tests. Nasal culture A sample of mucus is taken from your nose and sent to a lab and screened for bacteria. Nasal cytology A sample of mucus is taken from your  nose and examined by your caregiver to determine if your sinusitis is related to an allergy. TREATMENT  Most cases of acute sinusitis are related to a viral infection and will resolve on their own within 10 days. Sometimes medicines are prescribed to help relieve symptoms (pain medicine, decongestants, nasal steroid sprays, or saline sprays).  However, for sinusitis related to a bacterial infection, your caregiver will prescribe antibiotic medicines. These are medicines that will help kill the bacteria causing the infection.  Rarely, sinusitis is caused by a fungal infection. In theses cases, your caregiver will prescribe antifungal medicine. For some cases of chronic sinusitis, surgery is needed. Generally, these are cases in which sinusitis recurs more than 3 times per year, despite other treatments. HOME CARE INSTRUCTIONS  Drink plenty of water. Water helps thin the mucus so your sinuses can drain more easily. Use a humidifier. Inhale steam 3 to 4 times a day (for example, sit in the bathroom with the shower running). Apply a warm, moist washcloth to your face 3 to 4 times a day, or as directed by your caregiver. Use saline nasal sprays to help moisten and clean your sinuses. Take over-the-counter or prescription medicines for pain, discomfort, or fever only as directed by your caregiver. SEEK IMMEDIATE MEDICAL CARE IF: You have increasing pain or severe headaches. You have nausea, vomiting, or drowsiness. You have swelling around your face. You have vision problems. You have a stiff neck. You have difficulty breathing. MAKE SURE YOU:  Understand these instructions. Will watch your condition. Will get help right away if you are not doing well or get worse. Document Released: 08/13/2005 Document Revised: 11/05/2011 Document Reviewed: 08/28/2011 [facility] Patient Information 2014 [name], [name].    If you have been instructed to have an in-person evaluation today at a local Urgent Care  facility, please use the link below. It will take you to a list of all of our available [facility], including address, phone number and hours of operation. Please do not delay care.  [facility]  If you or a family member do not have a primary care provider, use the link below to schedule a visit and establish care. When you choose a [facility] primary care physician or advanced practice provider, you gain a long-term partner in health. Find a Primary Care Provider  Learn more about [facility]'s in-office and virtual care options: [facility]

## 2022-07-24 NOTE — Progress Notes (Signed)
Virtual Visit Consent   Kristin Herrera, you are scheduled for a virtual visit with a [facility] provider today. Just as with appointments in the office, your consent must be obtained to participate. Your consent will be active for this visit and any virtual visit you may have with one of our providers in the next 365 days. If you have a MyChart account, a copy of this consent can be sent to you electronically.  As this is a virtual visit, video technology does not allow for your provider to perform a traditional examination. This may limit your provider's ability to fully assess your condition. If your provider identifies any concerns that need to be evaluated in person or the need to arrange testing (such as labs, EKG, etc.), we will make arrangements to do so. Although advances in technology are sophisticated, we cannot ensure that it will always work on either your end or our end. If the connection with a video visit is poor, the visit may have to be switched to a telephone visit. With either a video or telephone visit, we are not always able to ensure that we have a secure connection.  By engaging in this virtual visit, you consent to the provision of healthcare and authorize for your insurance to be billed (if applicable) for the services provided during this visit. Depending on your insurance coverage, you may receive a charge related to this service.  I need to obtain your verbal consent now. Are you willing to proceed with your visit today? Kristin Herrera has provided verbal consent on 07/24/2022 for a virtual visit (video or telephone). [name], [place]  Date: 07/24/2022 7:55 AM  Virtual Visit via Video Note   I, [name], connected with  Kristin Herrera  ([id number], 1971/08/11) on 07/24/22 at  7:45 AM EST by a video-enabled telemedicine application and verified that I am speaking with the correct person using two identifiers.  Location: Patient: Virtual Visit Location  Patient: Home Provider: Virtual Visit Location Provider: Home Office   I discussed the limitations of evaluation and management by telemedicine and the availability of in person appointments. The patient expressed understanding and agreed to proceed.    History of Present Illness: Kristin Herrera is a 51 y.o. who identifies as a female who was assigned female at birth, and is being seen today for 5-6 days of progressively worsening nasal and head congestion with sinus pressure and frontal sinus pain. Notes intermittent low-grade fever. Denies any substantial cough or chest congestion. Denies SOB. Has been taking OTC Dayquil/Nyquil.  HPI: HPI  Problems:  Patient Active Problem List   Diagnosis Date Noted   Depression 03/23/2022   Plantar fasciitis, bilateral 04/17/2021   Leukocytosis 09/05/2016   Routine screening for STI (sexually transmitted infection) 09/05/2016   Preventative health care 08/02/2015   Anemia, iron deficiency 04/11/2015   History of syphilis 04/11/2015   Hyperlipemia 10/19/2013   Pain in joint, shoulder region 09/21/2013   Menorrhagia 09/03/2012   Knee pain 09/04/2011   Borderline diabetes 06/11/2011   General medical examination 06/04/2011   Thyromegaly 06/04/2011   HTN (hypertension) 05/04/2011    Allergies:  Allergies  Allergen Reactions   Simvastatin Other (See Comments)    cramping   Medications:  Current Outpatient Medications:    amoxicillin-clavulanate (AUGMENTIN) 875-125 MG tablet, Take 1 tablet by mouth 2 (two) times daily., Disp: 14 tablet, Rfl: 0   fluticasone (FLONASE) 50 MCG/ACT nasal spray, Place 2 sprays into  both nostrils daily., Disp: 16 g, Rfl: 0   amLODipine (NORVASC) 10 MG tablet, Take 1 tablet (10 mg total) by mouth daily., Disp: 90 tablet, Rfl: 1   metoprolol succinate (TOPROL-XL) 50 MG 24 hr tablet, TAKE 1 TABLET(50 MG) BY MOUTH DAILY WITH OR IMMEDIATELY FOLLOWING A MEAL, Disp: 90 tablet, Rfl: 1  Observations/Objective: Patient is  well-developed, well-nourished in no acute distress.  Resting comfortably at home.  Head is normocephalic, atraumatic.  No labored breathing.  Speech is clear and coherent with logical content.  Patient is alert and oriented at baseline.   Assessment and Plan: 1. Acute bacterial sinusitis - amoxicillin-clavulanate (AUGMENTIN) 875-125 MG tablet; Take 1 tablet by mouth 2 (two) times daily.  Dispense: 14 tablet; Refill: 0 - fluticasone (FLONASE) 50 MCG/ACT nasal spray; Place 2 sprays into both nostrils daily.  Dispense: 16 g; Refill: 0  Rx Augmentin.  Increase fluids.  Rest.  Saline nasal spray.  Probiotic.  Mucinex as directed.  Humidifier in bedroom. Flonase per orders. Can use Coricidin HBP products OTC to help with symptoms but not affect her BP.  Call or return to clinic if symptoms are not improving.   Follow Up Instructions: I discussed the assessment and treatment plan with the patient. The patient was provided an opportunity to ask questions and all were answered. The patient agreed with the plan and demonstrated an understanding of the instructions.  A copy of instructions were sent to the patient via MyChart unless otherwise noted below.   The patient was advised to call back or seek an in-person evaluation if the symptoms worsen or if the condition fails to improve as anticipated.  Time:  I spent 10 minutes with the patient via telehealth technology discussing the above problems/concerns.    [name], PA-C

## 2022-09-07 ENCOUNTER — Other Ambulatory Visit: Payer: Self-pay | Admitting: Family Medicine

## 2022-09-07 DIAGNOSIS — E010 Iodine-deficiency related diffuse (endemic) goiter: Secondary | ICD-10-CM

## 2022-09-28 ENCOUNTER — Ambulatory Visit: Payer: No Typology Code available for payment source | Admitting: Family

## 2022-09-28 ENCOUNTER — Ambulatory Visit
Admission: RE | Admit: 2022-09-28 | Discharge: 2022-09-28 | Disposition: A | Discharge status: Home / Self Care | Payer: No Typology Code available for payment source | Source: Ambulatory Visit | Attending: Family Medicine | Admitting: Family Medicine

## 2022-09-28 DIAGNOSIS — E010 Iodine-deficiency related diffuse (endemic) goiter: Secondary | ICD-10-CM

## 2022-10-12 ENCOUNTER — Other Ambulatory Visit: Payer: Self-pay

## 2022-10-12 MED ORDER — AMLODIPINE BESYLATE 10 MG PO TABS: 10.0000 mg | ORAL_TABLET | Freq: Every day | ORAL | 1 refills | Status: AC

## 2024-09-14 ENCOUNTER — Telehealth: Payer: Self-pay

## 2024-09-14 NOTE — Telephone Encounter (Signed)
 OK

## 2024-09-14 NOTE — Telephone Encounter (Signed)
 Copied from CRM [id number]. Topic: Appointments - Scheduling Inquiry for Clinic >> Sep 14, 2024  3:58 PM [facility] ORN wrote: Reason for CRM: Pt called to see if she can re-establish with Melissa. Pt was last seen 03/23/22 and said she initial left due to pt moving.   Please call back to advise if she can re-establish, [phone]

## 2024-09-15 NOTE — Telephone Encounter (Signed)
 Lvm to sched

## 2024-11-25 ENCOUNTER — Ambulatory Visit: Admitting: Family
